# Patient Record
Sex: Female | Born: 1985 | Race: White | Hispanic: No | Marital: Married | Smoking: Current every day smoker
Health system: Southern US, Community
[De-identification: ages and names within clinical notes are randomized; demographics above are authoritative.]

## PROBLEM LIST (undated history)

## (undated) DIAGNOSIS — R569 Unspecified convulsions: Secondary | ICD-10-CM

## (undated) DIAGNOSIS — G43909 Migraine, unspecified, not intractable, without status migrainosus: Secondary | ICD-10-CM

## (undated) DIAGNOSIS — K219 Gastro-esophageal reflux disease without esophagitis: Secondary | ICD-10-CM

## (undated) DIAGNOSIS — D649 Anemia, unspecified: Secondary | ICD-10-CM

## (undated) DIAGNOSIS — N806 Endometriosis in cutaneous scar: Secondary | ICD-10-CM

## (undated) HISTORY — DX: Migraine, unspecified, not intractable, without status migrainosus: G43.909

## (undated) HISTORY — DX: Endometriosis in cutaneous scar: N80.6

## (undated) HISTORY — PX: LAPAROSCOPIC ENDOMETRIOSIS FULGURATION: SUR769

## (undated) HISTORY — PX: WISDOM TOOTH EXTRACTION: SHX21

## (undated) HISTORY — DX: Unspecified convulsions: R56.9

## (undated) HISTORY — PX: TONSILLECTOMY: SUR1361

---
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
## 2005-03-12 ENCOUNTER — Ambulatory Visit: Payer: Self-pay | Admitting: Psychiatry

## 2005-07-16 ENCOUNTER — Emergency Department: Payer: Self-pay | Admitting: Emergency Medicine

## 2005-08-10 ENCOUNTER — Inpatient Hospital Stay: Payer: Self-pay | Admitting: Internal Medicine

## 2005-12-10 ENCOUNTER — Ambulatory Visit: Payer: Self-pay

## 2006-12-21 ENCOUNTER — Emergency Department: Payer: Self-pay | Admitting: Emergency Medicine

## 2007-01-25 ENCOUNTER — Encounter: Payer: Self-pay | Admitting: Family Medicine

## 2007-02-06 ENCOUNTER — Encounter: Payer: Self-pay | Admitting: Family Medicine

## 2007-04-10 ENCOUNTER — Emergency Department: Payer: Self-pay | Admitting: Unknown Physician Specialty

## 2007-08-25 ENCOUNTER — Emergency Department: Payer: Self-pay | Admitting: Emergency Medicine

## 2007-09-25 ENCOUNTER — Emergency Department: Payer: Self-pay | Admitting: Emergency Medicine

## 2008-06-27 ENCOUNTER — Ambulatory Visit: Payer: Self-pay | Admitting: Family Medicine

## 2009-05-30 ENCOUNTER — Ambulatory Visit: Payer: Self-pay

## 2009-05-31 ENCOUNTER — Ambulatory Visit: Payer: Self-pay

## 2010-02-13 ENCOUNTER — Emergency Department: Payer: Self-pay | Admitting: Emergency Medicine

## 2010-05-26 ENCOUNTER — Emergency Department: Payer: Self-pay | Admitting: Emergency Medicine

## 2010-09-23 ENCOUNTER — Encounter: Payer: Self-pay | Admitting: Maternal and Fetal Medicine

## 2010-12-02 ENCOUNTER — Observation Stay: Payer: Self-pay

## 2010-12-17 ENCOUNTER — Encounter: Payer: Self-pay | Admitting: Pediatric Cardiology

## 2011-01-21 ENCOUNTER — Encounter: Payer: Self-pay | Admitting: Pediatric Cardiology

## 2011-01-26 ENCOUNTER — Observation Stay: Payer: Self-pay

## 2011-02-26 ENCOUNTER — Observation Stay: Payer: Self-pay

## 2011-03-12 ENCOUNTER — Observation Stay: Payer: Self-pay

## 2011-03-28 ENCOUNTER — Observation Stay: Payer: Self-pay

## 2011-04-01 ENCOUNTER — Inpatient Hospital Stay: Payer: Self-pay

## 2011-07-24 ENCOUNTER — Ambulatory Visit: Payer: Self-pay | Admitting: Internal Medicine

## 2011-09-25 ENCOUNTER — Ambulatory Visit: Payer: Self-pay | Admitting: Otolaryngology

## 2012-03-01 ENCOUNTER — Ambulatory Visit: Payer: Self-pay | Admitting: Neurology

## 2012-05-16 ENCOUNTER — Emergency Department: Payer: Self-pay | Admitting: Emergency Medicine

## 2012-05-16 LAB — COMPREHENSIVE METABOLIC PANEL
Albumin: 3.7 g/dL (ref 3.4–5.0)
Alkaline Phosphatase: 98 U/L (ref 50–136)
Anion Gap: 10 (ref 7–16)
Bilirubin,Total: 0.3 mg/dL (ref 0.2–1.0)
Chloride: 103 mmol/L (ref 98–107)
EGFR (African American): >60
EGFR (Non-African Amer.): >60
Glucose: 116 mg/dL — ABNORMAL HIGH (ref 65–99)
Potassium: 3.6 mmol/L (ref 3.5–5.1)
SGOT(AST): 25 U/L (ref 15–37)
Total Protein: 7.5 g/dL (ref 6.4–8.2)

## 2012-05-16 LAB — CBC
HCT: 45.9 % (ref 35.0–47.0)
HGB: 15.3 g/dL (ref 12.0–16.0)
MCH: 31.3 pg (ref 26.0–34.0)
RBC: 4.87 10*6/uL (ref 3.80–5.20)
RDW: 13.9 % (ref 11.5–14.5)
WBC: 7.3 10*3/uL (ref 3.6–11.0)

## 2012-05-16 LAB — URINALYSIS, COMPLETE
Glucose,UR: NEGATIVE mg/dL (ref 0–75)
Ketone: NEGATIVE
Protein: NEGATIVE
Specific Gravity: 1.02 (ref 1.003–1.030)
WBC UR: 1 /HPF (ref 0–5)

## 2012-05-16 LAB — PREGNANCY, URINE: Pregnancy Test, Urine: NEGATIVE m[IU]/mL

## 2013-01-27 ENCOUNTER — Emergency Department: Payer: Self-pay | Admitting: Emergency Medicine

## 2013-01-27 LAB — CBC
HCT: 43.6 % (ref 35.0–47.0)
MCH: 31.5 pg (ref 26.0–34.0)
MCHC: 34.2 g/dL (ref 32.0–36.0)
MCV: 92 fL (ref 80–100)
Platelet: 217 10*3/uL (ref 150–440)
RBC: 4.73 10*6/uL (ref 3.80–5.20)
RDW: 13.4 % (ref 11.5–14.5)
WBC: 8.1 10*3/uL (ref 3.6–11.0)

## 2013-01-27 LAB — URINALYSIS, COMPLETE
Glucose,UR: NEGATIVE mg/dL (ref 0–75)
Ketone: NEGATIVE
Leukocyte Esterase: NEGATIVE
Nitrite: NEGATIVE
Ph: 6 (ref 4.5–8.0)
Protein: NEGATIVE
Specific Gravity: 1.014 (ref 1.003–1.030)
WBC UR: <1 /HPF (ref 0–5)

## 2013-01-27 LAB — HCG, QUANTITATIVE, PREGNANCY: Beta Hcg, Quant.: 33800 m[IU]/mL — ABNORMAL HIGH

## 2013-03-11 LAB — HM PAP SMEAR: HM PAP: NORMAL

## 2013-07-01 DIAGNOSIS — Q270 Congenital absence and hypoplasia of umbilical artery: Secondary | ICD-10-CM | POA: Insufficient documentation

## 2013-09-04 ENCOUNTER — Observation Stay: Payer: Self-pay

## 2013-09-04 LAB — URINALYSIS, COMPLETE
Bacteria: NONE SEEN
Glucose,UR: NEGATIVE mg/dL (ref 0–75)
Nitrite: NEGATIVE
Ph: 6 (ref 4.5–8.0)
Protein: 30
RBC,UR: 314 /HPF (ref 0–5)
Specific Gravity: 1.023 (ref 1.003–1.030)
Squamous Epithelial: 3

## 2013-09-07 ENCOUNTER — Ambulatory Visit: Payer: Self-pay | Admitting: Obstetrics and Gynecology

## 2013-09-07 ENCOUNTER — Observation Stay: Payer: Self-pay | Admitting: Obstetrics and Gynecology

## 2013-09-07 HISTORY — PX: TUBAL LIGATION: SHX77

## 2013-09-07 LAB — CBC WITH DIFFERENTIAL/PLATELET
Basophil %: 0.2 %
Eosinophil #: 0 10*3/uL (ref 0.0–0.7)
Eosinophil %: 0.1 %
HCT: 40.5 % (ref 35.0–47.0)
HGB: 14 g/dL (ref 12.0–16.0)
Lymphocyte #: 1.9 10*3/uL (ref 1.0–3.6)
Lymphocyte %: 18.9 %
MCH: 32.3 pg (ref 26.0–34.0)
MCHC: 34.7 g/dL (ref 32.0–36.0)
Monocyte #: 0.6 x10 3/mm (ref 0.2–0.9)
Monocyte %: 6.1 %
Platelet: 204 10*3/uL (ref 150–440)
RDW: 15 % — ABNORMAL HIGH (ref 11.5–14.5)

## 2013-09-08 ENCOUNTER — Inpatient Hospital Stay: Payer: Self-pay | Admitting: Obstetrics and Gynecology

## 2014-07-22 ENCOUNTER — Emergency Department: Payer: Self-pay | Admitting: Emergency Medicine

## 2014-07-22 LAB — BASIC METABOLIC PANEL
Anion Gap: 10 (ref 7–16)
BUN: 11 mg/dL (ref 7–18)
CHLORIDE: 110 mmol/L — AB (ref 98–107)
CO2: 22 mmol/L (ref 21–32)
CREATININE: 0.89 mg/dL (ref 0.60–1.30)
Calcium, Total: 8.4 mg/dL — ABNORMAL LOW (ref 8.5–10.1)
EGFR (African American): >60
EGFR (Non-African Amer.): >60
Glucose: 117 mg/dL — ABNORMAL HIGH (ref 65–99)
Osmolality: 284 (ref 275–301)
Potassium: 3.8 mmol/L (ref 3.5–5.1)
Sodium: 142 mmol/L (ref 136–145)

## 2014-07-22 LAB — CBC
HCT: 41.4 % (ref 35.0–47.0)
HGB: 14.3 g/dL (ref 12.0–16.0)
MCH: 31.3 pg (ref 26.0–34.0)
MCHC: 34.5 g/dL (ref 32.0–36.0)
MCV: 91 fL (ref 80–100)
Platelet: 160 10*3/uL (ref 150–440)
RBC: 4.57 10*6/uL (ref 3.80–5.20)
RDW: 13.7 % (ref 11.5–14.5)
WBC: 7.9 10*3/uL (ref 3.6–11.0)

## 2015-03-30 NOTE — Op Note (Signed)
PATIENT NAME:  Kaitlin Phillips, Kaitlin Phillips MR#:  [id number] DATE OF BIRTH:  Jun 17, 1986  DATE OF PROCEDURE:  09/08/2013  PREOPERATIVE DIAGNOSES:  1.  Intrauterine pregnancy at [redacted] weeks gestational age.  2.  Morbid obesity.  3.  History of prior cesarean section, desires repeat.  4.  Multiparity, desires permanent sterilization.   POSTOPERATIVE DIAGNOSES: 1.  Intrauterine pregnancy at [redacted] weeks gestational age.  2.  Morbid obesity.  3.  History of prior cesarean section, desires repeat.  4.  Multiparity, desires permanent sterilization.   PROCEDURE:  1.  Repeat low transverse cesarean section.  2.  Bilateral tubal ligation using [place] method.   SURGEON: Thomasene Mohair, M.D.   ESTIMATED BLOOD LOSS: 1000 mL.  OPERATIVE FLUIDS: 2500 mL crystalloid.   COMPLICATIONS: None.   FINDINGS:  1.  Normal-appearing gravid uterus, fallopian tubes and ovaries.  2.  Viable female infant with Apgars of 8 at one minute and 9 at five minutes with a weight of 2950 grams.  3.  Very thin lower uterine segment.   SPECIMENS: Portion of right and left fallopian tubes.   CONDITION AT END OF PROCEDURE: Stable.   INDICATION FOR PROCEDURE: The patient is 29 year old, gravida 2, para 1- 0-0-1 at [redacted] weeks gestational age. The patient has a history of prior cesarean section and desired repeat. The patient also strongly desired permanent form of sterilization and has been consented regarding alternatives and the risks and benefits of each. She was also counseled regarding the failure rate of approximately 4 out of every 1000 tubal ligations with increased risk of ectopic pregnancy should pregnancy occur. She still strongly desired to undergo sterilization and C-section. She was therefore taken to the operating room.   PROCEDURE IN DETAIL: The patient was taken to operating room where spinal anesthesia was administered and found to be adequate. She was prepped and draped in the dorsal supine position with a leftward tilt after  a timeout was called. A Pfannenstiel incision was made and carried through the various layers until the peritoneum was identified and entered sharply. Peritoneal opening was extended and a bladder flap was created. The bladder was pulled out of the operative area of interest using a bladder retractor. A very thin lower uterine segment was noted and was bluntly then entered and then extended with cranial and caudal tension. The amniotic sac was ruptured with noted meconium. The fetal vertex was then grasped and elevated to the hysterotomy and delivered with the head followed by the shoulders and the rest of the body without difficulty. The cord was clamped and then cut, and the infant was handed off to the awaiting pediatrician. Cord blood was collected. The placenta was then removed and the uterus was exteriorized and cleared of all clots and debris. The hysterotomy was closed using #0 Vicryl in a running locked fashion. A second layer of the same suture was used to obtain hemostasis. Additional figure-of-eight suture was used to obtain hemostasis.   Attention was turned to the left fallopian tube, which was identified, followed out to the fimbria. A Babcock clamp was placed approximately 3 to 4 cm away from the cornual region. An approximately 3 cm segment of tube was ligated using #0 plain gut. The same procedure was carried out on the right side without difficulty. An extra stitch of Vicryl was needed on the distal ends to obtain hemostasis. After hemostasis was assured, the uterus was returned to the abdomen and the gutters were cleared of all clots and debris. The peritoneum  was closed using #0 Vicryl in a running fashion. The fascia was closed using #0 Maxon using 2 separate sutures each starting from the apices and meeting in the midline where they were tied together. The subcutaneous tissue was closed using 2-0 plain gut such that no greater than 2 cm of its base remained. The skin was closed using  staples.   The patient tolerated the procedure well. Sponge, lap, and needle counts were correct x 2. For antibiotic prophylaxis, the patient received 3 grams of Ancef prior to skin incision. For VTE prophylaxis, the patient was wearing SCDs, which were in place and operating throughout the entire procedure. The patient was taken to the recovery area in stable condition.   ____________________________ Conard NovakStephen D. Jackson, MD sdj:aw D: 09/08/2013 09:45:00 ET T: 09/08/2013 09:59:57 ET JOB#: [id number]  cc: Conard NovakStephen D. Jackson, MD, <Dictator> Conard NovakSTEPHEN D JACKSON MD ELECTRONICALLY SIGNED 09/27/2013 15:19

## 2015-04-17 NOTE — H&P (Signed)
L&D Evaluation:  History:  HPI Pt is a 29 yo G2P1001 at 38.[redacted] weeks GA who presents with c/o fluid leaking in her underwear at home. Pt reports having some fluid leaking around 7am with some bloody discharge. +FM. Reports some lower abdominal pressure and discomfort. Denies urinary symptoms   Presents with leaking fluid   Patient's Medical History other  seizure d/o, pelvic fx, head injury, migraines   Patient's Surgical History Previous C-Section   Medications Pre Natal Vitamins   Allergies other, vicodin, paxil, dilantin   Social History none   Family History Non-Contributory   ROS:  ROS All systems were reviewed.  HEENT, CNS, GI, GU, Respiratory, CV, Renal and Musculoskeletal systems were found to be normal.   Exam:  Vital Signs stable   Urine Protein 3+, blood present in urine   General no apparent distress   Mental Status clear   Chest clear   Heart normal sinus rhythm   Abdomen gravid, non-tender   Back no CVAT   Edema 1+   Mebranes Intact, negative pooling or nitrazine   FHT normal rate with no decels   FHT Description reactive NST   Fetal Heart Rate 140    Ucx absent   Skin dry, no lesions   Lymph no lymphadenopathy    Other Urine- pH 1.023, 3+ blood, 30 protein, negative nitrites or leuks, 314 RBC's, negative bacteria, 3 WBC's.   Impression:  Impression dehydration, reactive NST   Plan:  Plan fluids, discharge, increase fluid intake, instructions given when to call the provider. Discussed s/sx of labor and FKC. Comfort measures for abd pain given. Keep office app[facility].   Electronic Signatures: Jannet MantisSubudhi, Kebrina Friend (CNM)  (Signed 28-Sep-14 10:29)  Authored: L&D Evaluation   Last Updated: 28-Sep-14 10:29 by Jannet MantisSubudhi, Dorismar Chay (CNM)

## 2015-11-19 ENCOUNTER — Ambulatory Visit (INDEPENDENT_AMBULATORY_CARE_PROVIDER_SITE_OTHER): Payer: 59 | Admitting: Physician Assistant

## 2015-11-19 ENCOUNTER — Encounter: Payer: Self-pay | Admitting: Physician Assistant

## 2015-11-19 VITALS — BP 122/78 | HR 99 | Temp 98.0°F | Resp 16 | Wt 253.6 lb

## 2015-11-19 DIAGNOSIS — Z8669 Personal history of other diseases of the nervous system and sense organs: Secondary | ICD-10-CM | POA: Insufficient documentation

## 2015-11-19 DIAGNOSIS — J029 Acute pharyngitis, unspecified: Secondary | ICD-10-CM

## 2015-11-19 DIAGNOSIS — Z87898 Personal history of other specified conditions: Secondary | ICD-10-CM | POA: Insufficient documentation

## 2015-11-19 DIAGNOSIS — J0140 Acute pansinusitis, unspecified: Secondary | ICD-10-CM

## 2015-11-19 MED ORDER — SULFAMETHOXAZOLE-TMP DS 800-160 MG PO TABS: 1.0000 | ORAL_TABLET | Freq: Two times a day (BID) | ORAL | Status: DC

## 2015-11-19 NOTE — Patient Instructions (Addendum)
Sinusitis, Adult °Sinusitis is redness, soreness, and inflammation of the paranasal sinuses. Paranasal sinuses are air pockets within the bones of your face. They are located beneath your eyes, in the middle of your forehead, and above your eyes. In healthy paranasal sinuses, mucus is able to drain out, and air is able to circulate through them by way of your nose. However, when your paranasal sinuses are inflamed, mucus and air can become trapped. This can allow bacteria and other germs to grow and cause infection. °Sinusitis can develop quickly and last only a short time (acute) or continue over a long period (chronic). Sinusitis that lasts for more than 12 weeks is considered chronic. °CAUSES °Causes of sinusitis include: °· Allergies. °· Structural abnormalities, such as displacement of the cartilage that separates your nostrils (deviated septum), which can decrease the air flow through your nose and sinuses and affect sinus drainage. °· Functional abnormalities, such as when the small hairs (cilia) that line your sinuses and help remove mucus do not work properly or are not present. °SIGNS AND SYMPTOMS °Symptoms of acute and chronic sinusitis are the same. The primary symptoms are pain and pressure around the affected sinuses. Other symptoms include: °· Upper toothache. °· Earache. °· Headache. °· Bad breath. °· Decreased sense of smell and taste. °· A cough, which worsens when you are lying flat. °· Fatigue. °· Fever. °· Thick drainage from your nose, which often is green and may contain pus (purulent). °· Swelling and warmth over the affected sinuses. °DIAGNOSIS °Your health care provider will perform a physical exam. During your exam, your health care provider may perform any of the following to help determine if you have acute sinusitis or chronic sinusitis: °· Look in your nose for signs of abnormal growths in your nostrils (nasal polyps). °· Tap over the affected sinus to check for signs of  infection. °· View the inside of your sinuses using an imaging device that has a light attached (endoscope). °If your health care provider suspects that you have chronic sinusitis, one or more of the following tests may be recommended: °· Allergy tests. °· Nasal culture. A sample of mucus is taken from your nose, sent to a lab, and screened for bacteria. °· Nasal cytology. A sample of mucus is taken from your nose and examined by your health care provider to determine if your sinusitis is related to an allergy. °TREATMENT °Most cases of acute sinusitis are related to a viral infection and will resolve on their own within 10 days. Sometimes, medicines are prescribed to help relieve symptoms of both acute and chronic sinusitis. These may include pain medicines, decongestants, nasal steroid sprays, or saline sprays. °However, for sinusitis related to a bacterial infection, your health care provider will prescribe antibiotic medicines. These are medicines that will help kill the bacteria causing the infection. °Rarely, sinusitis is caused by a fungal infection. In these cases, your health care provider will prescribe antifungal medicine. °For some cases of chronic sinusitis, surgery is needed. Generally, these are cases in which sinusitis recurs more than 3 times per year, despite other treatments. °HOME CARE INSTRUCTIONS °· Drink plenty of water. Water helps thin the mucus so your sinuses can drain more easily. °· Use a humidifier. °· Inhale steam 3-4 times a day (for example, sit in the bathroom with the shower running). °· Apply a warm, moist washcloth to your face 3-4 times a day, or as directed by your health care provider. °· Use saline nasal sprays to help   moisten and clean your sinuses. °· Take medicines only as directed by your health care provider. °· If you were prescribed either an antibiotic or antifungal medicine, finish it all even if you start to feel better. °SEEK IMMEDIATE MEDICAL CARE IF: °· You have  increasing pain or severe headaches. °· You have nausea, vomiting, or drowsiness. °· You have swelling around your face. °· You have vision problems. °· You have a stiff neck. °· You have difficulty breathing. °  °This information is not intended to replace advice given to you by your health care provider. Make sure you discuss any questions you have with your health care provider. °  °Document Released: 11/24/2005 Document Revised: 12/15/2014 Document Reviewed: 12/09/2011 °Elsevier Interactive Patient Education ©2016 Elsevier Inc. ° °Pharyngitis °Pharyngitis is redness, pain, and swelling (inflammation) of your pharynx.  °CAUSES  °Pharyngitis is usually caused by infection. Most of the time, these infections are from viruses (viral) and are part of a cold. However, sometimes pharyngitis is caused by bacteria (bacterial). Pharyngitis can also be caused by allergies. Viral pharyngitis may be spread from person to person by coughing, sneezing, and personal items or utensils (cups, forks, spoons, toothbrushes). Bacterial pharyngitis may be spread from person to person by more intimate contact, such as kissing.  °SIGNS AND SYMPTOMS  °Symptoms of pharyngitis include:   °· Sore throat.   °· Tiredness (fatigue).   °· Low-grade fever.   °· Headache. °· Joint pain and muscle aches. °· Skin rashes. °· Swollen lymph nodes. °· Plaque-like film on throat or tonsils (often seen with bacterial pharyngitis). °DIAGNOSIS  °Your health care provider will ask you questions about your illness and your symptoms. Your medical history, along with a physical exam, is often all that is needed to diagnose pharyngitis. Sometimes, a rapid strep test is done. Other lab tests may also be done, depending on the suspected cause.  °TREATMENT  °Viral pharyngitis will usually get better in 3-4 days without the use of medicine. Bacterial pharyngitis is treated with medicines that kill germs (antibiotics).  °HOME CARE INSTRUCTIONS  °· Drink enough water  and fluids to keep your urine clear or pale yellow.   °· Only take over-the-counter or prescription medicines as directed by your health care provider:   °¨ If you are prescribed antibiotics, make sure you finish them even if you start to feel better.   °¨ Do not take aspirin.   °· Get lots of rest.   °· Gargle with 8 oz of salt water (½ tsp of salt per 1 qt of water) as often as every 1-2 hours to soothe your throat.   °· Throat lozenges (if you are not at risk for choking) or sprays may be used to soothe your throat. °SEEK MEDICAL CARE IF:  °· You have large, tender lumps in your neck. °· You have a rash. °· You cough up green, yellow-brown, or bloody spit. °SEEK IMMEDIATE MEDICAL CARE IF:  °· Your neck becomes stiff. °· You drool or are unable to swallow liquids. °· You vomit or are unable to keep medicines or liquids down. °· You have severe pain that does not go away with the use of recommended medicines. °· You have trouble breathing (not caused by a stuffy nose). °MAKE SURE YOU:  °· Understand these instructions. °· Will watch your condition. °· Will get help right away if you are not doing well or get worse. °  °This information is not intended to replace advice given to you by your health care provider. Make sure you discuss any   questions you have with your health care provider. °  °Document Released: 11/24/2005 Document Revised: 09/14/2013 Document Reviewed: 08/01/2013 °Elsevier Interactive Patient Education ©2016 Elsevier Inc. ° °

## 2015-11-19 NOTE — Progress Notes (Signed)
Patient: Kaitlin Phillips Female    DOB: [birth year]   29 y.o.   MRN: [id number] Visit Date: 11/19/2015  Today's Provider: Margaretann Loveless, PA-C   Chief Complaint  Patient presents with  . URI   Subjective:    URI  This is a new problem. The current episode started 1 to 4 weeks ago (Cold symptoms started 2 weeks ago). The problem has been unchanged (She has sons (2 and 4) who have also exhibited sogns of cold). The maximum temperature recorded prior to her arrival was 103 - 104 F (Fever started 3 days ago). The fever has been present for 1 to 2 days. Associated symptoms include congestion, coughing (mild), ear pain (right ear), headaches, joint pain, nausea, rhinorrhea, sinus pain, a sore throat and swollen glands. Pertinent negatives include no abdominal pain, chest pain, diarrhea, dysuria, joint swelling, neck pain, plugged ear sensation, rash, sneezing, vomiting or wheezing. She has tried acetaminophen (Took Tylenol at 5 am today; she has also tried Catering manager liqui-gels) for the symptoms. The treatment provided mild relief.      Allergies  Allergen Reactions  . Penicillins Shortness Of Breath, Swelling and Hives  . Dilantin  [Phenytoin Sodium Extended] Itching  . Vicodin  [Hydrocodone-Acetaminophen] Nausea Only and Nausea And Vomiting   Previous Medications   No medications on file    Review of Systems  Constitutional: Positive for fever, chills and fatigue.  HENT: Positive for congestion, ear pain (right ear), postnasal drip, rhinorrhea, sinus pressure and sore throat. Negative for ear discharge, hearing loss, sneezing, tinnitus, trouble swallowing and voice change.   Eyes: Negative.   Respiratory: Positive for cough (mild) and chest tightness. Negative for choking, shortness of breath, wheezing and stridor.   Cardiovascular: Negative for chest pain, palpitations and leg swelling.  Gastrointestinal: Positive for nausea. Negative for vomiting, abdominal pain and  diarrhea.  Genitourinary: Negative for dysuria.  Musculoskeletal: Positive for joint pain. Negative for neck pain and neck stiffness.  Skin: Negative for rash.  Neurological: Positive for headaches. Negative for dizziness.    Social History  Substance Use Topics  . Smoking status: Current Every Day Smoker -- 1.00 packs/day    Types: Cigarettes  . Smokeless tobacco: Never Used  . Alcohol Use: No   Objective:   BP 122/78 mmHg  Pulse 99  Temp(Src) 98 F (36.7 C) (Oral)  Resp 16  Wt 253 lb 9.6 oz (115.032 kg)  Physical Exam  Constitutional: She appears well-developed and well-nourished. No distress.  HENT:  Head: Normocephalic and atraumatic.  Right Ear: Hearing, external ear and ear canal normal. Tympanic membrane is not erythematous and not bulging. A middle ear effusion is present.  Left Ear: Hearing, external ear and ear canal normal. Tympanic membrane is not erythematous and not bulging. A middle ear effusion is present.  Nose: Right sinus exhibits maxillary sinus tenderness and frontal sinus tenderness. Left sinus exhibits maxillary sinus tenderness and frontal sinus tenderness.  Mouth/Throat: Uvula is midline and mucous membranes are normal. Posterior oropharyngeal erythema present. No oropharyngeal exudate or posterior oropharyngeal edema.  Neck: Normal range of motion. Neck supple. No tracheal deviation present. No thyromegaly present.  Cardiovascular: Normal rate, regular rhythm and normal heart sounds.  Exam reveals no gallop and no friction rub.   No murmur heard. Pulmonary/Chest: Effort normal and breath sounds normal. No stridor. No respiratory distress. She has no wheezes. She has no rales.  Lymphadenopathy:    She has cervical adenopathy.  Skin: She is not diaphoretic.  Vitals reviewed.       Assessment & Plan:     1. Acute pansinusitis, recurrence not specified Worsening 2 weeks with new onset of fever over the last few days. I will treat with Bactrim  800-160 mg to be taken twice daily 10 days. I also advised her to continue a decongestant for congestion and Tylenol for fever. I did advise to take the antibiotic with yogurt to help with GI upset and yeast infection. She voiced understanding. She is to call the office if symptoms fail to improve or worsen.  2. Pharyngitis Most likely secondary to postnasal drip. She does not have tonsils or adenoids. See above medical treatment plan.       Margaretann LovelessJennifer M Burnette, PA-C  [facility]

## 2016-01-01 ENCOUNTER — Ambulatory Visit (INDEPENDENT_AMBULATORY_CARE_PROVIDER_SITE_OTHER): Payer: Managed Care, Other (non HMO) | Admitting: Family Medicine

## 2016-01-01 ENCOUNTER — Encounter: Payer: Self-pay | Admitting: Family Medicine

## 2016-01-01 VITALS — BP 102/76 | HR 76 | Temp 97.6°F | Resp 16 | Wt 257.0 lb

## 2016-01-01 DIAGNOSIS — G43009 Migraine without aura, not intractable, without status migrainosus: Secondary | ICD-10-CM | POA: Diagnosis not present

## 2016-01-01 MED ORDER — KETOROLAC TROMETHAMINE 60 MG/2ML IM SOLN
60.0000 mg | Freq: Once | INTRAMUSCULAR | Status: AC
Start: 2016-01-01 — End: 2016-01-01
  Administered 2016-01-01: 60 mg via INTRAMUSCULAR

## 2016-01-01 MED ORDER — AMITRIPTYLINE HCL 25 MG PO TABS: 25.0000 mg | ORAL_TABLET | Freq: Every day | ORAL | Status: DC

## 2016-01-01 NOTE — Patient Instructions (Signed)
Update your eye exam
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
## 2016-01-03 ENCOUNTER — Telehealth: Payer: Self-pay | Admitting: Family Medicine

## 2016-01-03 ENCOUNTER — Encounter: Payer: Self-pay | Admitting: Family Medicine

## 2016-01-03 ENCOUNTER — Other Ambulatory Visit: Payer: Self-pay | Admitting: Family Medicine

## 2016-01-03 ENCOUNTER — Ambulatory Visit (INDEPENDENT_AMBULATORY_CARE_PROVIDER_SITE_OTHER): Payer: Managed Care, Other (non HMO) | Admitting: Family Medicine

## 2016-01-03 VITALS — BP 100/74 | HR 98 | Temp 97.6°F | Resp 16 | Wt 258.6 lb

## 2016-01-03 DIAGNOSIS — G43019 Migraine without aura, intractable, without status migrainosus: Secondary | ICD-10-CM

## 2016-01-03 DIAGNOSIS — Z8669 Personal history of other diseases of the nervous system and sense organs: Secondary | ICD-10-CM

## 2016-01-03 MED ORDER — SUMATRIPTAN SUCCINATE 50 MG PO TABS
50.0000 mg | ORAL_TABLET | Freq: Once | ORAL | Status: DC
Start: 2016-01-03 — End: 2016-12-22

## 2016-01-03 MED ORDER — ISOMETHEPTENE-DICHLORAL-APAP 65-100-325 MG PO CAPS: ORAL_CAPSULE | ORAL | Status: DC

## 2016-01-03 MED ORDER — METOPROLOL SUCCINATE ER 25 MG PO TB24: 25.0000 mg | ORAL_TABLET | Freq: Every day | ORAL | Status: DC

## 2016-01-03 NOTE — Telephone Encounter (Deleted)
Pt states she was in 2 days ago and rec'd a Rx amitriptyline (ELAVIL) 25 MG tablet.  Pt states she is still having a headache/migraine.  Pt is also having dry mouth and constipation.  Pt is requesting advise.  CB#[phone]/MW

## 2016-01-03 NOTE — Telephone Encounter (Signed)
Patient has been advised. KW 

## 2016-01-03 NOTE — Progress Notes (Signed)
Subjective:     Patient ID: Kaitlin Phillips, female   DOB: [birth year], 30 y.o.   MRN: [id number]  HPI  Chief Complaint  Patient presents with  . Migraine    Follow up from 01/01/16, last visit patient was prescribed Amitriptyline  qhs patient reports since starting medication her headache has got worse. Patient reports symptoms of dry mouth and constipation, she states that it has been harder to go to sleep at night.   States Toradol injection of two days ago did not abort her residual headache. Was able to sleep with amitriptyline the that night but headache returned yesterday. No further help from amitriptyline with side effects. She is accompanied by her two year old today.   Review of Systems     Objective:   Physical Exam  Constitutional: She appears well-developed and well-nourished. No distress.       Assessment:    1. Intractable migraine without aura and without status migrainosus - isometheptene-acetaminophen-dichloralphenazone (MIDRIN) 65-100-325 MG capsule; Maximum 5 capsules in 12 hours for migraine headaches. May take two at onset then one every hour if needed.  Dispense: 30 capsule; Refill: 0  2. History of migraine headaches - metoprolol succinate (TOPROL-XL) 25 MG 24 hr tablet; Take 1 tablet (25 mg total) by mouth daily.  Dispense: 30 tablet; Refill: 0    Plan:    Consider oral imitrex and/or referral to neurology.

## 2016-01-03 NOTE — Telephone Encounter (Signed)
Pharmacy does not have Metoprolol in stock.  [facility] it would be next week to get it in.  She is asking to get a refill on her Fioricet (pres by Dr. 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      Uses [facility] in [place].

## 2016-01-03 NOTE — Telephone Encounter (Signed)
Please let her know I have sent in Imitrex to try if the Midrin is unavailable. I do not prescribe Fioricet. If this is the only thing she wants may want to sit down with one of the M.D.'s here.

## 2016-01-03 NOTE — Patient Instructions (Signed)
Let me know if you are not tolerating the new medication or wish referral to neurology.

## 2016-02-11 ENCOUNTER — Ambulatory Visit (INDEPENDENT_AMBULATORY_CARE_PROVIDER_SITE_OTHER): Payer: Managed Care, Other (non HMO) | Admitting: Family Medicine

## 2016-02-11 ENCOUNTER — Encounter: Payer: Self-pay | Admitting: Family Medicine

## 2016-02-11 ENCOUNTER — Other Ambulatory Visit: Payer: Self-pay

## 2016-02-11 VITALS — BP 134/96 | HR 96 | Temp 97.5°F | Resp 16 | Wt 259.0 lb

## 2016-02-11 DIAGNOSIS — G43709 Chronic migraine without aura, not intractable, without status migrainosus: Secondary | ICD-10-CM | POA: Diagnosis not present

## 2016-02-11 DIAGNOSIS — Z720 Tobacco use: Secondary | ICD-10-CM

## 2016-02-11 MED ORDER — TOPIRAMATE 25 MG PO TABS: 50.0000 mg | ORAL_TABLET | Freq: Two times a day (BID) | ORAL | Status: DC
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
## 2016-03-11 ENCOUNTER — Ambulatory Visit: Payer: Managed Care, Other (non HMO) | Admitting: Family Medicine

## 2016-07-11 NOTE — Telephone Encounter (Signed)
error 

## 2016-12-12 ENCOUNTER — Other Ambulatory Visit: Payer: Self-pay | Admitting: Obstetrics and Gynecology

## 2016-12-12 ENCOUNTER — Encounter: Payer: Self-pay | Admitting: *Deleted

## 2016-12-12 ENCOUNTER — Other Ambulatory Visit (HOSPITAL_COMMUNITY): Payer: Self-pay | Admitting: Obstetrics and Gynecology

## 2016-12-12 DIAGNOSIS — M7989 Other specified soft tissue disorders: Secondary | ICD-10-CM

## 2016-12-12 DIAGNOSIS — R1032 Left lower quadrant pain: Secondary | ICD-10-CM

## 2016-12-22 ENCOUNTER — Ambulatory Visit (INDEPENDENT_AMBULATORY_CARE_PROVIDER_SITE_OTHER): Payer: Managed Care, Other (non HMO) | Admitting: General Surgery

## 2016-12-22 ENCOUNTER — Encounter: Payer: Self-pay | Admitting: General Surgery

## 2016-12-22 ENCOUNTER — Inpatient Hospital Stay: Payer: Self-pay

## 2016-12-22 VITALS — BP 120/70 | HR 82 | Resp 12 | Ht 65.0 in | Wt 215.0 lb

## 2016-12-22 DIAGNOSIS — R1032 Left lower quadrant pain: Secondary | ICD-10-CM

## 2016-12-22 DIAGNOSIS — R1904 Left lower quadrant abdominal swelling, mass and lump: Secondary | ICD-10-CM

## 2016-12-22 NOTE — Patient Instructions (Signed)
Follow up here after MRI on 10/24/17.

## 2016-12-22 NOTE — Progress Notes (Addendum)
Patient ID: Kaitlin Phillips, female   DOB: 30-Mar-1986, 31 y.o.   MRN: [id number]  Chief Complaint  Patient presents with  . Abdominal Pain    HPI Kaitlin Phillips is a 31 y.o. female here for assessment of left lower quadrant pain and possible hernia. She reports she has had pain and discomfort with this area for 3 years. It has increased significantly in the past 4 months. Describes the pain as a burning sensation, and states that she experiences diaphoresis, and dizziness upon standing occasionally with the pain. The area does bulge out. She does have a history of 2 cesarian sections. She is scheduled for an MRI of the abdomen on 12/24/16. She is here today with her mother, Kaitlin Phillips.  I have reviewed the history of present illness with the patient.  HPI  Past Medical History:  Diagnosis Date  . Anemia    h/o years ago  . GERD (gastroesophageal reflux disease)    takes apple cider vinegar daily and has no problems with GERD  . Migraine headache   . Seizure (HCC)    last seizure in 2008    Past Surgical History:  Procedure Laterality Date  . CESAREAN SECTION  09/08/2013   [facility]- x2  . EXCISION OF ABDOMINAL WALL TUMOR N/A 01/06/2017   Procedure: EXCISION OF ABDOMINAL WALL MASS;  Surgeon: Kieth Brightly, MD;  Location: [facility] ORS;  Service: General;  Laterality: N/A;  . FACIAL FRACTURE SURGERY  2000  . LAPAROSCOPIC ENDOMETRIOSIS FULGURATION    . TONSILLECTOMY     and tubes in ears  . TUBAL LIGATION  09/2013   [facility]  . WISDOM TOOTH EXTRACTION      Family History  Problem Relation Age of Onset  . Hypertension Mother   . Depression Sister   . Lung cancer Father     Social History Social History  Substance Use Topics  . Smoking status: Current Every Day Smoker    Packs/day: 1.00    Years: 7.00    Types: Cigarettes  . Smokeless tobacco: Never Used  . Alcohol use Yes     Comment: rare    Allergies  Allergen Reactions  . Amitriptyline Anaphylaxis  . Amoxicillin  Anaphylaxis  . Penicillins Hives, Shortness Of Breath and Swelling    ANY MEDS WITHIN THE "CILLIN"  FAMILY   . Dilantin  [Phenytoin Sodium Extended] Itching  . Vicodin  [Hydrocodone-Acetaminophen] Nausea Only and Nausea And Vomiting    Current Outpatient Prescriptions  Medication Sig Dispense Refill  . ibuprofen (ADVIL,MOTRIN) 800 MG tablet Take 800 mg by mouth every 8 (eight) hours as needed.    Marland Kitchen levonorgestrel (MIRENA) 20 MCG/24HR IUD 1 each by Intrauterine route once.    Marland Kitchen HYDROcodone-acetaminophen (NORCO) 5-325 MG tablet Take 1-2 tablets by mouth every 4 (four) hours as needed for moderate pain. 30 tablet 0  . Multiple Vitamins-Minerals (AIRBORNE PO) Take by mouth as needed.    Marland Kitchen Phenylephrine-Aspirin (ALKA-SELTZER PLUS SINUS PO) Take 1 tablet by mouth as needed.    . promethazine (PHENERGAN) 12.5 MG tablet Take 1 tablet (12.5 mg total) by mouth every 4 (four) hours as needed for nausea or vomiting. 20 tablet 0  . traMADol (ULTRAM) 50 MG tablet Take 1 tablet (50 mg total) by mouth every 6 (six) hours as needed. 20 tablet 0   No current facility-administered medications for this visit.     Review of Systems Review of Systems  Constitutional: Negative.   Respiratory: Negative.  Cardiovascular: Negative.     Blood pressure 120/70, pulse 82, resp. rate 12, height 5\' 5"  (1.651 m), weight 215 lb (97.5 kg).  Physical Exam Physical Exam  Constitutional: She is oriented to person, place, and time. She appears well-developed and well-nourished.  Eyes: Conjunctivae are normal. No scleral icterus.  Neck: Neck supple.  Cardiovascular: Normal rate, regular rhythm and normal heart sounds.   Pulmonary/Chest: Effort normal and breath sounds normal.  Abdominal: Soft. Normal appearance and bowel sounds are normal. There is tenderness in the left lower quadrant.    Lymphadenopathy:    She has no cervical adenopathy.  Neurological: She is alert and oriented to person, place, and time.    Skin: Skin is warm and dry.  Psychiatric: She has a normal mood and affect.    Data Reviewed Data reviewed.   Assessment    Abdominal ultrasound performed.  1-2 cm hypoechoic irregular poorly defined area identified within the muscle of the abdomen at the patient's most tender spot. Suspected endometriosis, need MRI results to confirm.    Plan    MRI of abdomen scheduled for 12/24/2016.  Follow-up after MRI to discuss results and plan.    This has been scribed by Sinda Duaryl-Lyn M Kennedy LPN    SANKAR,SEEPLAPUTHUR G 01/08/2017, 10:58 AM

## 2016-12-23 ENCOUNTER — Telehealth: Payer: Self-pay | Admitting: *Deleted

## 2016-12-23 NOTE — Telephone Encounter (Signed)
Patient called stating her insurance would not cover her for an MRI for tomorrow. Wants to know if there is another test that can be ordered ? She stated she will call Dr. Ramiro HarvestStaebler's office to let them know what is going on and let us know how she will proceed.

## 2016-12-23 NOTE — Telephone Encounter (Signed)
Patient called back and is going to still have her MRI tomorrow.

## 2016-12-24 ENCOUNTER — Ambulatory Visit
Admission: RE | Admit: 2016-12-24 | Discharge: 2016-12-24 | Disposition: A | Discharge status: Home / Self Care | Payer: Managed Care, Other (non HMO) | Source: Ambulatory Visit | Attending: Obstetrics and Gynecology | Admitting: Obstetrics and Gynecology

## 2016-12-24 DIAGNOSIS — M799 Soft tissue disorder, unspecified: Secondary | ICD-10-CM | POA: Diagnosis not present

## 2016-12-24 DIAGNOSIS — R1032 Left lower quadrant pain: Secondary | ICD-10-CM

## 2016-12-24 DIAGNOSIS — M7989 Other specified soft tissue disorders: Secondary | ICD-10-CM

## 2016-12-24 MED ORDER — GADOBENATE DIMEGLUMINE 529 MG/ML IV SOLN
20.0000 mL | Freq: Once | INTRAVENOUS | Status: AC | PRN
  Administered 2016-12-24: 20 mL via INTRAVENOUS

## 2016-12-30 ENCOUNTER — Ambulatory Visit (INDEPENDENT_AMBULATORY_CARE_PROVIDER_SITE_OTHER): Payer: Managed Care, Other (non HMO) | Admitting: General Surgery

## 2016-12-30 ENCOUNTER — Encounter: Payer: Self-pay | Admitting: General Surgery

## 2016-12-30 ENCOUNTER — Inpatient Hospital Stay: Payer: Self-pay

## 2016-12-30 VITALS — BP 126/74 | HR 80 | Resp 12 | Ht 65.0 in | Wt 217.0 lb

## 2016-12-30 DIAGNOSIS — R1900 Intra-abdominal and pelvic swelling, mass and lump, unspecified site: Secondary | ICD-10-CM | POA: Diagnosis not present

## 2016-12-30 NOTE — Patient Instructions (Signed)
The patient [facility]. She will pre admit by phone. The patient is aware of date and instructions.

## 2016-12-30 NOTE — Progress Notes (Signed)
Patient ID: MRY LAMIA, female   DOB: 18-Oct-1986, 31 y.o.   MRN: [id number]  Chief Complaint  Patient presents with  . Mass    HPI Kaitlin Phillips is a 31 y.o. female here today to discuss abdominal wall mass. MRI of abdomen on  12/24/2016. States that pain in area is worse than during last visit and more tender.  I have reviewed the history of present illness with the patient.   HPI  Past Medical History:  Diagnosis Date  . Migraine headache   . Seizure [facility]     Past Surgical History:  Procedure Laterality Date  . CESAREAN SECTION  09/08/13   [facility]  . TUBAL LIGATION  09/2013   [facility]    Family History  Problem Relation Age of Onset  . Hypertension Mother   . Depression Sister   . Lung cancer Father     Social History Social History  Substance Use Topics  . Smoking status: Current Every Day Smoker    Packs/day: 1.00    Types: Cigarettes  . Smokeless tobacco: Never Used  . Alcohol use Yes    Allergies  Allergen Reactions  . Amitriptyline Anaphylaxis  . Penicillins Shortness Of Breath, Swelling and Hives  . Dilantin  [Phenytoin Sodium Extended] Itching  . Vicodin  [Hydrocodone-Acetaminophen] Nausea Only and Nausea And Vomiting    Current Outpatient Prescriptions  Medication Sig Dispense Refill  . ibuprofen (ADVIL,MOTRIN) 800 MG tablet Take 800 mg by mouth every 8 (eight) hours as needed.    Marland Kitchen levonorgestrel (MIRENA) 20 MCG/24HR IUD 1 each by Intrauterine route once.     No current facility-administered medications for this visit.     Review of Systems Review of Systems  Constitutional: Negative.   Respiratory: Negative.   Cardiovascular: Negative.     Blood pressure 126/74, pulse 80, resp. rate 12, height 5\' 5"  (1.651 m), weight 217 lb (98.4 kg).  Physical Exam Physical Exam  Data Reviewed Prior notes, ultrasound, and MRI. MRI showed a mass in the left lower abdominal area.   Assessment    Core biopsy performed with patient consent.    Plan    Procedure Note: CORE BIOPSY REPORT  Name:  Kaitlin Phillips DOB:  1986/12/08  Vital signs:BP 126/74   Pulse 80   Resp 12   Ht 5\' 5"  (1.651 m)   Wt 217 lb (98.4 kg)   BMI 36.11 kg/m   Lesion:  Mass abdominal wall left lower quadrant   Local anesthetic:   10 ml of 1% Xylocaine/0.5% Marcaine  Prep: Chloraprep Solution  Device:  14 Gauge Bard    Ultrasound guidance:  Used  Patient tolerance: Patient tolerated the procedure well  Approach: LM  Clip: None  Dressing: Steristrips telfa tegaderm  Ice pack applied. Written instructions provided to patient regarding wound care.   Patient advised that patient will be contacted by phone when pathology report is available.  Given the severe pain patient has associated with this mass, which is located into the muscle area. Excision would be most appropriate for relief of pain. More than likely, this is endometriosis given the pain associated with it. If pathology suggests a tumor that requires more radical resection, we can plan accordingly. Patient is agreeable to this.    [facility] G       The patient is scheduled for surgery at [facility] on 01/06/17. She will pre admit by phone. The patient is aware of date and instructions.   This information has  been scribed by Ples SpecterJessica Qualls CMA.    Kashari Chalmers G 12/30/2016, 3:21 PM

## 2016-12-31 ENCOUNTER — Telehealth: Payer: Self-pay

## 2016-12-31 ENCOUNTER — Telehealth: Payer: Self-pay | Admitting: *Deleted

## 2016-12-31 NOTE — Telephone Encounter (Signed)
Called pt on her mobile phone. She states she is feeling better since this afternoon. No more nausea or vomiting.  Path report is still pending. Will likely have a report tomorrow, will call her then and check on how she is feeling

## 2016-12-31 NOTE — Telephone Encounter (Signed)
Patient called back this afternoon, and is still feeling bad. She states she found out that her husband has the Flu. Since it was late in the afternoon, I recommended contacting her Primary care Doctor. She wanted to wait to speak with you since she is scheduled to have surgery next week. Please advise. Thanks

## 2016-12-31 NOTE — Telephone Encounter (Signed)
Patient called and reports that when she got out of bed today she started having nausea and had vomited several times. She drank some Ginger ale and took her antinausea medication(Promethazine 25 mg). She reports than that she threw up again about 25 minutes after. She states that she also has a low grade fever of 99.9 and some chills. She wanted to make sure we were aware of what was going on as she is scheduled for surgery on 01/06/17. Dr Evette CristalSankar notified.

## 2017-01-01 ENCOUNTER — Telehealth: Payer: Self-pay | Admitting: *Deleted

## 2017-01-01 NOTE — Telephone Encounter (Signed)
-----   Message from Kieth BrightlySeeplaputhur G Sankar, MD sent at 01/01/2017  9:15 AM EST ----- Please inform pt -path showed endometriosis. Will plan for excision as scheduled.

## 2017-01-01 NOTE — Telephone Encounter (Signed)
Notified patient as instructed, patient pleased. Discussed follow-up appointments, patient agrees  

## 2017-01-02 ENCOUNTER — Encounter
Admission: RE | Admit: 2017-01-02 | Discharge: 2017-01-02 | Disposition: A | Discharge status: Home / Self Care | Payer: Managed Care, Other (non HMO) | Source: Ambulatory Visit | Attending: General Surgery | Admitting: General Surgery

## 2017-01-02 HISTORY — DX: Anemia, unspecified: D64.9

## 2017-01-02 HISTORY — DX: Gastro-esophageal reflux disease without esophagitis: K21.9

## 2017-01-02 NOTE — Patient Instructions (Signed)
  Your procedure is scheduled on: 01-06-17 Report to Same Day Surgery 2nd floor medical mall [facility](Medical Mall Entrance-take elevator on left to 2nd floor.  Check in with surgery information desk.) To find out your arrival time please call [phone] between 1PM - 3PM on 01-05-17  Remember: Instructions that are not followed completely may result in serious medical risk, up to and including death, or upon the discretion of your surgeon and anesthesiologist your surgery may need to be rescheduled.    _x___ 1. Do not eat food or drink liquids after midnight. No gum chewing or hard candies.     __x__ 2. No Alcohol for 24 hours before or after surgery.   __x__3. No Smoking for 24 prior to surgery.   ____  4. Bring all medications with you on the day of surgery if instructed.    __x__ 5. Notify your doctor if there is any change in your medical condition     (cold, fever, infections).     Do not wear jewelry, make-up, hairpins, clips or nail polish.  Do not wear lotions, powders, or perfumes. You may wear deodorant.  Do not shave 48 hours prior to surgery. Men may shave face and neck.  Do not bring valuables to the hospital.    [facility] is not responsible for any belongings or valuables.               Contacts, dentures or bridgework may not be worn into surgery.  Leave your suitcase in the car. After surgery it may be brought to your room.  For patients admitted to the hospital, discharge time is determined by your treatment team.   Patients discharged the day of surgery will not be allowed to drive home.  You will need someone to drive you home and stay with you the night of your procedure.    Please read over the following fact sheets that you were given:   [facility] Preparing for Surgery and or MRSA Information   _x___ Take these medicines the morning of surgery with A SIP OF WATER:    1. NONE  2.  3.  4.  5.  6.  ____Fleets enema or Magnesium Citrate as directed.   _x___  Use CHG Soap or sage wipes as directed on instruction sheet   ____ Use inhalers on the day of surgery and bring to hospital day of surgery  ____ Stop metformin 2 days prior to surgery    ____ Take 1/2 of usual insulin dose the night before surgery and none on the morning of  surgery.   ____ Stop Aspirin, Coumadin, Pllavix ,Eliquis, Effient, or Pradaxa  x__ Stop Anti-inflammatories such as Advil, Aleve, Ibuprofen, Motrin, Naproxen,          Naprosyn, Goodies powders or aspirin products NOW-Ok to take Tylenol-CHECK TO MAKE SURE YOUR ALKA-SELTZER SINUS DOES NOT HAVE  ANY ASPIRIN IN IT   _X___ Stop supplements until after surgery-STOP AIRBORNE NOW  ____ Bring C-Pap to the hospital.

## 2017-01-06 ENCOUNTER — Ambulatory Visit
Admission: RE | Admit: 2017-01-06 | Discharge: 2017-01-06 | Disposition: A | Discharge status: Home / Self Care | Payer: Managed Care, Other (non HMO) | Source: Ambulatory Visit | Attending: General Surgery | Admitting: General Surgery

## 2017-01-06 ENCOUNTER — Encounter
Admission: RE | Disposition: A | Discharge status: Home / Self Care | Payer: Self-pay | Source: Ambulatory Visit | Attending: General Surgery

## 2017-01-06 ENCOUNTER — Ambulatory Visit: Payer: Managed Care, Other (non HMO) | Admitting: Anesthesiology

## 2017-01-06 ENCOUNTER — Encounter: Payer: Self-pay | Admitting: *Deleted

## 2017-01-06 ENCOUNTER — Other Ambulatory Visit: Payer: Self-pay | Admitting: General Surgery

## 2017-01-06 DIAGNOSIS — Z793 Long term (current) use of hormonal contraceptives: Secondary | ICD-10-CM | POA: Diagnosis not present

## 2017-01-06 DIAGNOSIS — N808 Other endometriosis: Secondary | ICD-10-CM | POA: Diagnosis present

## 2017-01-06 DIAGNOSIS — R1900 Intra-abdominal and pelvic swelling, mass and lump, unspecified site: Secondary | ICD-10-CM

## 2017-01-06 DIAGNOSIS — N809 Endometriosis, unspecified: Secondary | ICD-10-CM | POA: Diagnosis not present

## 2017-01-06 DIAGNOSIS — F17210 Nicotine dependence, cigarettes, uncomplicated: Secondary | ICD-10-CM | POA: Diagnosis not present

## 2017-01-06 HISTORY — PX: EXCISION OF ABDOMINAL WALL TUMOR: SHX6687

## 2017-01-06 LAB — POCT PREGNANCY, URINE: Preg Test, Ur: NEGATIVE

## 2017-01-06 SURGERY — EXCISION, NEOPLASM, ABDOMINAL WALL
Anesthesia: General

## 2017-01-06 MED ORDER — GLYCOPYRROLATE 0.2 MG/ML IJ SOLN
INTRAMUSCULAR | Status: AC
  Filled 2017-01-06: qty 1

## 2017-01-06 MED ORDER — PROPOFOL 10 MG/ML IV BOLUS
INTRAVENOUS | Status: AC
  Filled 2017-01-06: qty 20

## 2017-01-06 MED ORDER — LIDOCAINE HCL (CARDIAC) 20 MG/ML IV SOLN
INTRAVENOUS | Status: DC | PRN
  Administered 2017-01-06: 100 mg via INTRAVENOUS

## 2017-01-06 MED ORDER — ONDANSETRON HCL 4 MG/2ML IJ SOLN: 4.0000 mg | Freq: Once | INTRAMUSCULAR | Status: DC | PRN

## 2017-01-06 MED ORDER — FENTANYL CITRATE (PF) 100 MCG/2ML IJ SOLN
25.0000 ug | INTRAMUSCULAR | Status: AC | PRN
  Administered 2017-01-06 (×6): 25 ug via INTRAVENOUS

## 2017-01-06 MED ORDER — NALOXONE HCL 2 MG/2ML IJ SOSY
0.0500 mg | PREFILLED_SYRINGE | Freq: Once | INTRAMUSCULAR | Status: AC
  Administered 2017-01-06: 0.05 mg via INTRAVENOUS

## 2017-01-06 MED ORDER — PHENYLEPHRINE HCL 10 MG/ML IJ SOLN
INTRAMUSCULAR | Status: DC | PRN
  Administered 2017-01-06 (×3): 100 ug via INTRAVENOUS

## 2017-01-06 MED ORDER — ACETAMINOPHEN 10 MG/ML IV SOLN
INTRAVENOUS | Status: AC
Start: 2017-01-06 — End: 2017-01-06
  Filled 2017-01-06: qty 100

## 2017-01-06 MED ORDER — CIPROFLOXACIN IN D5W 400 MG/200ML IV SOLN: 400.0000 mg | INTRAVENOUS | Status: DC

## 2017-01-06 MED ORDER — DIPHENHYDRAMINE HCL 50 MG/ML IJ SOLN
25.0000 mg | Freq: Once | INTRAMUSCULAR | Status: AC
  Administered 2017-01-06: 25 mg via INTRAVENOUS

## 2017-01-06 MED ORDER — SODIUM CHLORIDE 0.9 % IJ SOLN
INTRAMUSCULAR | Status: AC
  Filled 2017-01-06: qty 10

## 2017-01-06 MED ORDER — BUPIVACAINE HCL (PF) 0.5 % IJ SOLN
INTRAMUSCULAR | Status: DC | PRN
  Administered 2017-01-06: 30 mL

## 2017-01-06 MED ORDER — PROPOFOL 10 MG/ML IV BOLUS
INTRAVENOUS | Status: DC | PRN
  Administered 2017-01-06: 180 mg via INTRAVENOUS
  Administered 2017-01-06: 20 mg via INTRAVENOUS

## 2017-01-06 MED ORDER — SUGAMMADEX SODIUM 200 MG/2ML IV SOLN
INTRAVENOUS | Status: DC | PRN
  Administered 2017-01-06: 200 mg via INTRAVENOUS

## 2017-01-06 MED ORDER — ROCURONIUM BROMIDE 50 MG/5ML IV SOSY
PREFILLED_SYRINGE | INTRAVENOUS | Status: AC
Start: 2017-01-06 — End: 2017-01-06
  Filled 2017-01-06: qty 5

## 2017-01-06 MED ORDER — DIPHENHYDRAMINE HCL 50 MG/ML IJ SOLN
INTRAMUSCULAR | Status: AC
  Filled 2017-01-06: qty 1

## 2017-01-06 MED ORDER — TRAMADOL HCL 50 MG PO TABS
ORAL_TABLET | ORAL | Status: AC
  Administered 2017-01-06: 50 mg via ORAL
  Filled 2017-01-06: qty 1

## 2017-01-06 MED ORDER — FENTANYL CITRATE (PF) 100 MCG/2ML IJ SOLN
INTRAMUSCULAR | Status: AC
  Filled 2017-01-06: qty 2

## 2017-01-06 MED ORDER — PROMETHAZINE HCL 12.5 MG PO TABS: 12.5000 mg | ORAL_TABLET | ORAL | 0 refills | Status: DC | PRN

## 2017-01-06 MED ORDER — ROCURONIUM BROMIDE 100 MG/10ML IV SOLN
INTRAVENOUS | Status: DC | PRN
  Administered 2017-01-06: 50 mg via INTRAVENOUS

## 2017-01-06 MED ORDER — CHLORHEXIDINE GLUCONATE CLOTH 2 % EX PADS: 6.0000 | MEDICATED_PAD | Freq: Once | CUTANEOUS | Status: DC

## 2017-01-06 MED ORDER — HYDROCODONE-ACETAMINOPHEN 5-325 MG PO TABS: 1.0000 | ORAL_TABLET | ORAL | 0 refills | Status: DC | PRN

## 2017-01-06 MED ORDER — CIPROFLOXACIN IN D5W 400 MG/200ML IV SOLN
INTRAVENOUS | Status: AC
  Filled 2017-01-06: qty 200

## 2017-01-06 MED ORDER — NALOXONE HCL 2 MG/2ML IJ SOSY
PREFILLED_SYRINGE | INTRAMUSCULAR | Status: AC
  Filled 2017-01-06: qty 2

## 2017-01-06 MED ORDER — LIDOCAINE HCL (PF) 2 % IJ SOLN
INTRAMUSCULAR | Status: AC
  Filled 2017-01-06: qty 2

## 2017-01-06 MED ORDER — MIDAZOLAM HCL 2 MG/2ML IJ SOLN
INTRAMUSCULAR | Status: DC | PRN
Start: 2017-01-06 — End: 2017-01-06
  Administered 2017-01-06: 1 mg via INTRAVENOUS

## 2017-01-06 MED ORDER — SUGAMMADEX SODIUM 200 MG/2ML IV SOLN
INTRAVENOUS | Status: AC
  Filled 2017-01-06: qty 2

## 2017-01-06 MED ORDER — LIDOCAINE HCL (PF) 1 % IJ SOLN
INTRAMUSCULAR | Status: AC
Start: 2017-01-06 — End: 2017-01-06
  Filled 2017-01-06: qty 30

## 2017-01-06 MED ORDER — MIDAZOLAM HCL 2 MG/2ML IJ SOLN
INTRAMUSCULAR | Status: AC
  Filled 2017-01-06: qty 2

## 2017-01-06 MED ORDER — ACETAMINOPHEN 10 MG/ML IV SOLN
INTRAVENOUS | Status: DC | PRN
  Administered 2017-01-06: 1000 mg via INTRAVENOUS

## 2017-01-06 MED ORDER — ONDANSETRON HCL 4 MG/2ML IJ SOLN
INTRAMUSCULAR | Status: AC
Start: 2017-01-06 — End: 2017-01-06
  Filled 2017-01-06: qty 2

## 2017-01-06 MED ORDER — FAMOTIDINE 20 MG PO TABS
20.0000 mg | ORAL_TABLET | Freq: Once | ORAL | Status: AC
  Administered 2017-01-06: 20 mg via ORAL

## 2017-01-06 MED ORDER — FENTANYL CITRATE (PF) 100 MCG/2ML IJ SOLN
INTRAMUSCULAR | Status: DC | PRN
  Administered 2017-01-06: 50 ug via INTRAVENOUS
  Administered 2017-01-06: 25 ug via INTRAVENOUS
  Administered 2017-01-06: 100 ug via INTRAVENOUS
  Administered 2017-01-06: 25 ug via INTRAVENOUS

## 2017-01-06 MED ORDER — DEXAMETHASONE SODIUM PHOSPHATE 10 MG/ML IJ SOLN
INTRAMUSCULAR | Status: DC | PRN
  Administered 2017-01-06: 10 mg via INTRAVENOUS

## 2017-01-06 MED ORDER — DEXAMETHASONE SODIUM PHOSPHATE 10 MG/ML IJ SOLN
INTRAMUSCULAR | Status: AC
  Filled 2017-01-06: qty 1

## 2017-01-06 MED ORDER — TRAMADOL HCL 50 MG PO TABS: 50.0000 mg | ORAL_TABLET | Freq: Four times a day (QID) | ORAL | 0 refills | Status: DC | PRN

## 2017-01-06 MED ORDER — FENTANYL CITRATE (PF) 100 MCG/2ML IJ SOLN
INTRAMUSCULAR | Status: AC
  Administered 2017-01-06: 25 ug via INTRAVENOUS
  Filled 2017-01-06: qty 2

## 2017-01-06 MED ORDER — FAMOTIDINE 20 MG PO TABS
ORAL_TABLET | ORAL | Status: AC
  Filled 2017-01-06: qty 1

## 2017-01-06 MED ORDER — PHENYLEPHRINE HCL 10 MG/ML IJ SOLN
INTRAMUSCULAR | Status: AC
  Filled 2017-01-06: qty 1

## 2017-01-06 MED ORDER — ESMOLOL HCL 100 MG/10ML IV SOLN
INTRAVENOUS | Status: DC | PRN
  Administered 2017-01-06: 10 mg via INTRAVENOUS

## 2017-01-06 MED ORDER — BUPIVACAINE HCL (PF) 0.5 % IJ SOLN
INTRAMUSCULAR | Status: AC
  Filled 2017-01-06: qty 30

## 2017-01-06 MED ORDER — LACTATED RINGERS IV SOLN
INTRAVENOUS | Status: DC
  Administered 2017-01-06 (×2): via INTRAVENOUS

## 2017-01-06 MED ORDER — TRAMADOL HCL 50 MG PO TABS
50.0000 mg | ORAL_TABLET | Freq: Four times a day (QID) | ORAL | Status: DC | PRN
  Administered 2017-01-06: 50 mg via ORAL

## 2017-01-06 SURGICAL SUPPLY — 23 items
BLADE SURG 15 STRL LF DISP TIS (BLADE) ×1 IMPLANT
BLADE SURG 15 STRL SS (BLADE) ×2
CANISTER SUCT 1200ML W/VALVE (MISCELLANEOUS) ×3 IMPLANT
CHLORAPREP W/TINT 26ML (MISCELLANEOUS) ×3 IMPLANT
DRAPE LAPAROTOMY T 102X78X121 (DRAPES) ×3 IMPLANT
ELECT REM PT RETURN 9FT ADLT (ELECTROSURGICAL) ×3
ELECTRODE REM PT RTRN 9FT ADLT (ELECTROSURGICAL) ×1 IMPLANT
GLOVE BIO SURGEON STRL SZ7.5 (GLOVE) IMPLANT
GLOVE BIOGEL M 6.5 STRL (GLOVE) ×6 IMPLANT
GLOVE BIOGEL M 7.0 STRL (GLOVE) ×3 IMPLANT
GLOVE INDICATOR 8.0 STRL GRN (GLOVE) IMPLANT
GOWN STRL REUS W/ TWL LRG LVL3 (GOWN DISPOSABLE) ×3 IMPLANT
GOWN STRL REUS W/TWL LRG LVL3 (GOWN DISPOSABLE) ×6
KIT RM TURNOVER STRD PROC AR (KITS) ×3 IMPLANT
LIQUID BAND (GAUZE/BANDAGES/DRESSINGS) ×3 IMPLANT
NDL SAFETY 22GX1.5 (NEEDLE) ×3 IMPLANT
PACK BASIN MINOR ARMC (MISCELLANEOUS) ×3 IMPLANT
SUT MNCRL AB 4-0 PS2 18 (SUTURE) ×3 IMPLANT
SUT VIC AB 2-0 SH 27 (SUTURE) ×4
SUT VIC AB 2-0 SH 27XBRD (SUTURE) ×2 IMPLANT
SUT VIC AB 3-0 SH 27 (SUTURE) ×2
SUT VIC AB 3-0 SH 27X BRD (SUTURE) ×1 IMPLANT
SYRINGE 10CC LL (SYRINGE) ×3 IMPLANT
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
## 2017-01-06 NOTE — Progress Notes (Signed)
Tramadol not adequate for pain. Will rx w/ norco and phenergan.

## 2017-01-06 NOTE — Interval H&P Note (Signed)
History and Physical Interval Note:Core biopsy confirmed endometrioma. Given the severe painnassociated with this excision recommended and pt is agreeable. 01/06/2017 8:48 AM  Deirdre PeerHolly S Linsley  has presented today for surgery, with the diagnosis of mass abdominal wall  The various methods of treatment have been discussed with the patient and family. After consideration of risks, benefits and other options for treatment, the patient has consented to  Procedure(s): EXCISION OF ABDOMINAL WALL MASS (N/A) as a surgical intervention .  The patient's history has been reviewed, patient examined, no change in status, stable for surgery.  I have reviewed the patient's chart and labs.  Questions were answered to the patient's satisfaction.     Leimomi Zervas G

## 2017-01-06 NOTE — Anesthesia Post-op Follow-up Note (Cosign Needed)
Anesthesia QCDR form completed.        

## 2017-01-06 NOTE — H&P (View-Only) (Signed)
Patient ID: Kaitlin Phillips, female   DOB: 18-Oct-1986, 31 y.o.   MRN: [id number]  Chief Complaint  Patient presents with  . Mass    HPI Kaitlin Phillips is a 31 y.o. female here today to discuss abdominal wall mass. MRI of abdomen on  12/24/2016. States that pain in area is worse than during last visit and more tender.  I have reviewed the history of present illness with the patient.   HPI  Past Medical History:  Diagnosis Date  . Migraine headache   . Seizure [facility])     Past Surgical History:  Procedure Laterality Date  . CESAREAN SECTION  09/08/13   [facility]  . TUBAL LIGATION  09/2013   [facility]    Family History  Problem Relation Age of Onset  . Hypertension Mother   . Depression Sister   . Lung cancer Father     Social History Social History  Substance Use Topics  . Smoking status: Current Every Day Smoker    Packs/day: 1.00    Types: Cigarettes  . Smokeless tobacco: Never Used  . Alcohol use Yes    Allergies  Allergen Reactions  . Amitriptyline Anaphylaxis  . Penicillins Shortness Of Breath, Swelling and Hives  . Dilantin  [Phenytoin Sodium Extended] Itching  . Vicodin  [Hydrocodone-Acetaminophen] Nausea Only and Nausea And Vomiting    Current Outpatient Prescriptions  Medication Sig Dispense Refill  . ibuprofen (ADVIL,MOTRIN) 800 MG tablet Take 800 mg by mouth every 8 (eight) hours as needed.    Marland Kitchen levonorgestrel (MIRENA) 20 MCG/24HR IUD 1 each by Intrauterine route once.     No current facility-administered medications for this visit.     Review of Systems Review of Systems  Constitutional: Negative.   Respiratory: Negative.   Cardiovascular: Negative.     Blood pressure 126/74, pulse 80, resp. rate 12, height 5\' 5"  (1.651 m), weight 217 lb (98.4 kg).  Physical Exam Physical Exam  Data Reviewed Prior notes, ultrasound, and MRI. MRI showed a mass in the left lower abdominal area.   Assessment    Core biopsy performed with patient consent.    Plan    Procedure Note: CORE BIOPSY REPORT  Name:  Kaitlin Phillips DOB:  1986/12/08  Vital signs:BP 126/74   Pulse 80   Resp 12   Ht 5\' 5"  (1.651 m)   Wt 217 lb (98.4 kg)   BMI 36.11 kg/m   Lesion:  Mass abdominal wall left lower quadrant   Local anesthetic:   10 ml of 1% Xylocaine/0.5% Marcaine  Prep: Chloraprep Solution  Device:  14 Gauge Bard    Ultrasound guidance:  Used  Patient tolerance: Patient tolerated the procedure well  Approach: LM  Clip: None  Dressing: Steristrips telfa tegaderm  Ice pack applied. Written instructions provided to patient regarding wound care.   Patient advised that patient will be contacted by phone when pathology report is available.  Given the severe pain patient has associated with this mass, which is located into the muscle area. Excision would be most appropriate for relief of pain. More than likely, this is endometriosis given the pain associated with it. If pathology suggests a tumor that requires more radical resection, we can plan accordingly. Patient is agreeable to this.    [facility] G       The patient is scheduled for surgery at [facility] on 01/06/17. She will pre admit by phone. The patient is aware of date and instructions.   This information has  been scribed by Ples SpecterJessica Qualls CMA.    Imad Shostak G 12/30/2016, 3:21 PM

## 2017-01-06 NOTE — Anesthesia Procedure Notes (Signed)
Procedure Name: Intubation Date/Time: 01/06/2017 9:35 AM Performed by: Doreen Salvage Pre-anesthesia Checklist: Patient identified, Patient being monitored, Timeout performed, Emergency Drugs available and Suction available Patient Re-evaluated:Patient Re-evaluated prior to inductionOxygen Delivery Method: Circle system utilized Preoxygenation: Pre-oxygenation with 100% oxygen Intubation Type: IV induction Ventilation: Mask ventilation without difficulty Laryngoscope Size: Mac, McGraph, 4 and 3 (DL with MAC 3v x2. DL with Mcgrath MAC 4 x1) Grade View: Grade I Tube type: Oral Tube size: 7.0 mm Number of attempts: 3 Airway Equipment and Method: Stylet and Bougie stylet Placement Confirmation: ETT inserted through vocal cords under direct vision,  positive ETCO2 and breath sounds checked- equal and bilateral Secured at: 21 cm Tube secured with: Tape Dental Injury: Teeth and Oropharynx as per pre-operative assessment  Difficulty Due To: Difficult Airway- due to anterior larynx Comments: Grade 3 view with DL with MAC 3. Grade 1 view with Meredith Staggers

## 2017-01-06 NOTE — Interval H&P Note (Signed)
History and Physical Interval Note:  01/06/2017 8:47 AM  Kaitlin Phillips  has presented today for surgery, with the diagnosis of mass abdominal wall  The various methods of treatment have been discussed with the patient and family. After consideration of risks, benefits and other options for treatment, the patient has consented to  Procedure(s): EXCISION OF ABDOMINAL WALL MASS (N/A) as a surgical intervention .  The patient's history has been reviewed, patient examined, no change in status, stable for surgery.  I have reviewed the patient's chart and labs.  Questions were answered to the patient's satisfaction.     Leshia Kope G

## 2017-01-06 NOTE — Discharge Instructions (Signed)

## 2017-01-06 NOTE — Op Note (Signed)
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 G. Sankar   Assistant:     Anesthesia: Gen.  Complications: None  EBL:  less than 10 mL  Drains: None  Description: Patient was put to sleep in supine position. The left lower abdominal area was then required with the ultrasound and the mass in question the deep subcutaneous plane overlying the fascia was identified and marked on the skin a transverse incision was mapped accordingly directly overlying the area of the mass. The abdomen was then prepped and draped as sterile field. Timeout was performed. 0.5% Marcaine totaling 30 mL was instilled around the planned incision for postop analgesia. Skin incision was then made and deepened through the subcutaneous tissue into the deep subcutaneous plane. With the use of cautery for control of bleeding and then dissected the deeply seated palpable mass was identified. This was located in the deep subcutaneous plane overlying the fascia. With careful finger palpation circumferential dissection was completed and the mass removed in entirety. Measurement of the excised the mass was a 5 x 4 x 3 cm. This was sent in formalin for pathology. After ensuring hemostasis the deepest layers were closed with interrupted 2-0 Vicryl. Subcutaneous tissue closed with running 3-0 Vicryl. Skin was closed with subcuticular 4-0 Monocryl and covered with Dermabond. Procedure was well-tolerated and patient subsequently extubated and returned recovery room stable condition.

## 2017-01-06 NOTE — H&P (View-Only) (Signed)
Patient ID: Kaitlin Phillips, female   DOB: 03/24/1986, 31 y.o.   MRN: [id number]  Chief Complaint  Patient presents with  . Abdominal Pain    HPI Kaitlin Phillips is a 31 y.o. female here for assessment of left lower quadrant pain and possible hernia. She reports she has had pain and discomfort with this area for 3 years. It has increased significantly in the past 4 months. Describes the pain as a burning sensation, and states that she experiences diaphoresis, and dizziness upon standing occasionally with the pain. The area does bulge out. She does have a history of 2 cesarian sections. She is scheduled for an MRI of the abdomen on 12/24/16. She is here today with her mother, Kaitlin Phillips.  I have reviewed the history of present illness with the patient.  HPI  Past Medical History:  Diagnosis Date  . Migraine headache   . Seizure [facility])     Past Surgical History:  Procedure Laterality Date  . CESAREAN SECTION  09/08/13   [facility]  . TUBAL LIGATION  09/2013   [facility]    Family History  Problem Relation Age of Onset  . Hypertension Mother   . Depression Sister   . Lung cancer Father     Social History Social History  Substance Use Topics  . Smoking status: Current Every Day Smoker    Packs/day: 1.00    Types: Cigarettes  . Smokeless tobacco: Never Used  . Alcohol use Yes    Allergies  Allergen Reactions  . Amitriptyline Anaphylaxis  . Penicillins Shortness Of Breath, Swelling and Hives  . Dilantin  [Phenytoin Sodium Extended] Itching  . Vicodin  [Hydrocodone-Acetaminophen] Nausea Only and Nausea And Vomiting    Current Outpatient Prescriptions  Medication Sig Dispense Refill  . ibuprofen (ADVIL,MOTRIN) 800 MG tablet Take 800 mg by mouth every 8 (eight) hours as needed.    Marland Kitchen. levonorgestrel (MIRENA) 20 MCG/24HR IUD 1 each by Intrauterine route once.     No current facility-administered medications for this visit.     Review of Systems Review of Systems  Constitutional:  Negative.   Respiratory: Negative.   Cardiovascular: Negative.     Blood pressure 120/70, pulse 82, resp. rate 12, height 5\' 5"  (1.651 Phillips), weight 215 lb (97.5 kg).  Physical Exam Physical Exam  Constitutional: She is oriented to person, place, and time. She appears well-developed and well-nourished.  Eyes: Conjunctivae are normal. No scleral icterus.  Neck: Neck supple.  Cardiovascular: Normal rate, regular rhythm and normal heart sounds.   Pulmonary/Chest: Effort normal and breath sounds normal.  Abdominal: Soft. Normal appearance and bowel sounds are normal. There is tenderness in the left lower quadrant.    Lymphadenopathy:    She has no cervical adenopathy.  Neurological: She is alert and oriented to person, place, and time.  Skin: Skin is warm and dry.  Psychiatric: She has a normal mood and affect.    Data Reviewed Data reviewed.   Assessment    Abdominal ultrasound performed.  1-2 cm hypoechoic irregular poorly defined area identified within the muscle of the abdomen at the patient's most tender spot. Suspected endometriosis, need MRI results to confirm.    Plan    MRI of abdomen scheduled for 12/24/2016.  Follow-up after MRI to discuss results and plan.    This has been scribed by Kaitlin Phillips    Kaitlin Phillips, Kaitlin Phillips 12/22/2016, 11:03 AM

## 2017-01-06 NOTE — Transfer of Care (Signed)
Immediate Anesthesia Transfer of Care Note  Patient: Kaitlin Phillips  Procedure(s) Performed: Procedure(s): EXCISION OF ABDOMINAL WALL MASS (N/A)  Patient Location: PACU  Anesthesia Type:General  Level of Consciousness: sedated  Airway & Oxygen Therapy: Patient Spontanous Breathing and Patient connected to face mask oxygen  Post-op Assessment: Report given to RN and Post -op Vital signs reviewed and stable  Post vital signs: Reviewed and stable  Last Vitals:  Vitals:   01/06/17 0911 01/06/17 1059  BP: (!) 127/99 121/71  Pulse: 78 91  Resp: 18 11  Temp: 36.4 C     Complications: No apparent anesthesia complications

## 2017-01-07 ENCOUNTER — Encounter: Payer: Self-pay | Admitting: General Surgery

## 2017-01-07 LAB — SURGICAL PATHOLOGY

## 2017-01-07 NOTE — Anesthesia Postprocedure Evaluation (Signed)
Anesthesia Post Note  Patient: Kaitlin Phillips  Procedure(s) Performed: Procedure(s) (LRB): EXCISION OF ABDOMINAL WALL MASS (N/A)  Patient location during evaluation: PACU Anesthesia Type: General Level of consciousness: awake and alert and oriented Pain management: pain level controlled Vital Signs Assessment: post-procedure vital signs reviewed and stable Respiratory status: spontaneous breathing Cardiovascular status: blood pressure returned to baseline Anesthetic complications: no     Last Vitals:  Vitals:   01/06/17 1227 01/06/17 1240  BP: 123/74 119/78  Pulse: 68 65  Resp: 16 16  Temp: 36.5 C 36.2 C    Last Pain:  Vitals:   01/07/17 1633  TempSrc:   PainSc: 0-No pain                 Kenard Morawski

## 2017-01-14 ENCOUNTER — Encounter: Payer: Self-pay | Admitting: General Surgery

## 2017-01-14 ENCOUNTER — Ambulatory Visit (INDEPENDENT_AMBULATORY_CARE_PROVIDER_SITE_OTHER): Payer: Managed Care, Other (non HMO) | Admitting: General Surgery

## 2017-01-14 VITALS — BP 118/72 | HR 78 | Resp 12 | Ht 65.0 in | Wt 211.0 lb

## 2017-01-14 DIAGNOSIS — R1904 Left lower quadrant abdominal swelling, mass and lump: Secondary | ICD-10-CM

## 2017-01-14 NOTE — Patient Instructions (Addendum)
Follow-up as needed.  Call back with any questions/concerns.

## 2017-01-14 NOTE — Progress Notes (Signed)
Patient ID: Kaitlin Phillips, female   DOB: 10/26/1986, 31 y.o.   MRN: [id number]  Chief Complaint  Patient presents with  . Routine Post Op    HPI Kaitlin CrimesHolly S Phillips is a 31 y.o. female.  Here today for postoperative visit, abdominal wall mass, path showed endometriosis. She states she is doing much better, still sore. I have reviewed the history of present illness with the patient.   HPI  Past Medical History:  Diagnosis Date  . Anemia    h/o years ago  . GERD (gastroesophageal reflux disease)    takes apple cider vinegar daily and has no problems with GERD  . Migraine headache   . Seizure (HCC)    last seizure in 2008    Past Surgical History:  Procedure Laterality Date  . CESAREAN SECTION  09/08/2013   [facility]- x2  . EXCISION OF ABDOMINAL WALL TUMOR N/A 01/06/2017   ENDOMETRIOSIS./ EXCISION OF ABDOMINAL WALL MASS;  Surgeon: Kieth BrightlySeeplaputhur G Gabriele Zwilling, MD;  Location: [facility] ORS;  Service: General;  Laterality: N/A;  . FACIAL FRACTURE SURGERY  2000  . LAPAROSCOPIC ENDOMETRIOSIS FULGURATION    . TONSILLECTOMY     and tubes in ears  . TUBAL LIGATION  09/2013   [facility]  . WISDOM TOOTH EXTRACTION      Family History  Problem Relation Age of Onset  . Hypertension Mother   . Depression Sister   . Lung cancer Father     Social History Social History  Substance Use Topics  . Smoking status: Current Every Day Smoker    Packs/day: 1.00    Years: 7.00    Types: Cigarettes  . Smokeless tobacco: Never Used  . Alcohol use Yes     Comment: rare    Allergies  Allergen Reactions  . Amitriptyline Anaphylaxis  . Amoxicillin Anaphylaxis  . Penicillins Hives, Shortness Of Breath and Swelling    ANY MEDS WITHIN THE "CILLIN"  FAMILY   . Dilantin  [Phenytoin Sodium Extended] Itching  . Vicodin  [Hydrocodone-Acetaminophen] Nausea Only and Nausea And Vomiting    Current Outpatient Prescriptions  Medication Sig Dispense Refill  . HYDROcodone-acetaminophen (NORCO) 5-325 MG tablet Take 1-2  tablets by mouth every 4 (four) hours as needed for moderate pain. 30 tablet 0  . ibuprofen (ADVIL,MOTRIN) 800 MG tablet Take 800 mg by mouth every 8 (eight) hours as needed.    Marland Kitchen. levonorgestrel (MIRENA) 20 MCG/24HR IUD 1 each by Intrauterine route once.    . Multiple Vitamins-Minerals (AIRBORNE PO) Take by mouth as needed.    Marland Kitchen. Phenylephrine-Aspirin (ALKA-SELTZER PLUS SINUS PO) Take 1 tablet by mouth as needed.    . promethazine (PHENERGAN) 12.5 MG tablet Take 1 tablet (12.5 mg total) by mouth every 4 (four) hours as needed for nausea or vomiting. 20 tablet 0   No current facility-administered medications for this visit.     Review of Systems Review of Systems  Constitutional: Negative.   Respiratory: Negative.   Cardiovascular: Negative.     Blood pressure 118/72, pulse 78, resp. rate 12, height 5\' 5"  (1.651 m), weight 211 lb (95.7 kg).  Physical Exam Physical Exam  Constitutional: She is oriented to person, place, and time. She appears well-developed and well-nourished.  Abdominal:    Neurological: She is alert and oriented to person, place, and time.  Skin: Skin is warm and dry.  Psychiatric: Her behavior is normal.    Data Reviewed Pathology and prior notes  Assessment    Stable postoperative exam. S/p  excision of LLQ abdominal wall mass. Pathology showed endometriosis.     Plan   Recommend she follow up with her gynecologist-Dr. Jean Rosenthal Follow-up as needed.  Call back with any questions/concerns.     This information has been scribed by Dorathy Daft RN, BSN,BC.    Charlet Harr G 01/14/2017, 1:30 PM

## 2017-02-09 ENCOUNTER — Ambulatory Visit (INDEPENDENT_AMBULATORY_CARE_PROVIDER_SITE_OTHER): Payer: Managed Care, Other (non HMO) | Admitting: Obstetrics and Gynecology

## 2017-02-09 ENCOUNTER — Encounter: Payer: Self-pay | Admitting: Obstetrics and Gynecology

## 2017-02-09 VITALS — BP 118/74 | Ht 65.0 in | Wt 216.0 lb

## 2017-02-09 DIAGNOSIS — E6609 Other obesity due to excess calories: Secondary | ICD-10-CM

## 2017-02-09 DIAGNOSIS — Z124 Encounter for screening for malignant neoplasm of cervix: Secondary | ICD-10-CM | POA: Diagnosis not present

## 2017-02-09 DIAGNOSIS — Z8041 Family history of malignant neoplasm of ovary: Secondary | ICD-10-CM | POA: Diagnosis not present

## 2017-02-09 DIAGNOSIS — Z01419 Encounter for gynecological examination (general) (routine) without abnormal findings: Secondary | ICD-10-CM

## 2017-02-09 DIAGNOSIS — N806 Endometriosis in cutaneous scar: Secondary | ICD-10-CM | POA: Insufficient documentation

## 2017-02-09 DIAGNOSIS — Z803 Family history of malignant neoplasm of breast: Secondary | ICD-10-CM | POA: Insufficient documentation

## 2017-02-09 DIAGNOSIS — Z6835 Body mass index (BMI) 35.0-35.9, adult: Secondary | ICD-10-CM | POA: Diagnosis not present

## 2017-02-09 NOTE — Assessment & Plan Note (Signed)
Discussed management options, including do nothing at this time versus suppression with progesterone.  Discussed pros/cons of each.  She elects to take no suppressive medication at this time.

## 2017-02-09 NOTE — Assessment & Plan Note (Signed)
See discussion of Family History of Breast Cancer

## 2017-02-09 NOTE — Assessment & Plan Note (Signed)
Discussed her family history in detail.  She may be at elevated risk. She will discuss with her mother regarding genetic testing for mutation.  She may be a candidate herself for testing.  Will continue to follow up.

## 2017-02-09 NOTE — Progress Notes (Addendum)
Gynecology Annual Exam  PCP: No PCP Per Patient  Chief Complaint:  Chief Complaint  Patient presents with  . Annual Exam    History of Present Illness: Patient is a 31 y.o. [id number] presents for annual exam. The patient has no complaints today.  She has no breast, bowel, bladder, or sexual dysfunction complaints.  She continues to menstruate irregularly due to the IUD without complaints.   Current contraception is:  Mirena IUD (placed 3 years ago 02/2014) She is sexually active with her husband and has no complaints. Last pap smear 02/2013: nilm.  She underwent surgery at the end of January for removal of subcutaneous endometriosis. She has no current pain or other complaints.   The patient wears seatbelts: yes.    The patient has regular exercise: yes.  The patient has ever been transfused or tattooed?: yes.  The patient reports that domestic violence in her life is absent.    Discussed family history of cancer.  Her mother had breast and ovarian cancer. Her maternal grandmother had ovarian cancer.  Review of Systems: Review of Systems  Constitutional: Negative.   HENT: Negative.   Eyes: Negative.   Respiratory: Negative.   Cardiovascular: Negative.   Gastrointestinal: Negative.   Genitourinary: Negative.   Musculoskeletal: Negative.   Skin: Negative.   Neurological: Negative.   Psychiatric/Behavioral: Negative.    Past Medical History:  Diagnosis Date  . Anemia    h/o years ago  . 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 GERD (gastroesophageal reflux disease)    takes apple cider vinegar daily and has no problems with GERD  . Migraine headache   . Seizure (HCC)    last seizure in 2008    Past Surgical History:  Procedure Laterality Date  . CESAREAN SECTION  09/08/2013   [facility]- x2  . EXCISION OF ABDOMINAL WALL TUMOR N/A 01/06/2017   ENDOMETRIOSIS./ EXCISION OF ABDOMINAL WALL MASS;  Surgeon: Kieth Brightly, MD;  Location: [facility] ORS;  Service: General;  Laterality: N/A;   . FACIAL FRACTURE SURGERY  2000  . LAPAROSCOPIC ENDOMETRIOSIS FULGURATION    . TONSILLECTOMY     and tubes in ears  . TUBAL LIGATION  09/2013   [facility]  . WISDOM TOOTH EXTRACTION      Medications:   Medication Sig Start Date End Date Taking? Authorizing Provider  levonorgestrel (MIRENA) 20 MCG/24HR IUD 1 each by Intrauterine route once.    Historical Provider, MD  Phenylephrine-Aspirin (ALKA-SELTZER PLUS SINUS PO) Take 1 tablet by mouth as needed.    Historical Provider, MD   Allergies  Allergen Reactions  . Amitriptyline Anaphylaxis  . Amoxicillin Anaphylaxis  . Penicillins Hives, Shortness Of Breath and Swelling    ANY MEDS WITHIN THE "CILLIN"  FAMILY   . Dilantin  [Phenytoin Sodium Extended] Itching  . Vicodin  [Hydrocodone-Acetaminophen] Nausea Only and Nausea And Vomiting    Gynecologic History: Patient's last menstrual period was 02/05/2017.  Obstetric History: G2P2002, s/p C-section x 2  Social History   Social History  . Marital status: Married    Spouse name: N/A  . Number of children: N/A  . Years of education: N/A   Occupational History  . Not on file.   Social History Main Topics  . Smoking status: Current Every Day Smoker    Packs/day: 1.00    Years: 7.00    Types: Cigarettes  . Smokeless tobacco: Never Used  . Alcohol use Yes     Comment: rare  . Drug use: No  .  Sexual activity: Yes    Birth control/ protection: IUD   Other Topics Concern  . Not on file   Social History Narrative  . No narrative on file   Family History  Problem Relation Age of Onset  . Hypertension Mother   . Breast cancer Mother   . Ovarian cancer Mother   . Depression Sister   . Lung cancer Father   . Ovarian cancer Maternal Grandmother    Physical Exam BP 118/74   Ht 5\' 5"  (1.651 m)   Wt 216 lb (98 kg)   LMP 02/05/2017   BMI 35.94 kg/m   General: NAD HEENT: normocephalic, anicteric Thyroid: no enlargement, no palpable nodules Pulmonary: No increased work of  breathing, CTAB Cardiovascular: RRR, distal pulses 2+ Breast: Breast symmetrical, no tenderness, no palpable nodules or masses, no skin or nipple retraction present, no nipple discharge.  No axillary or supraclavicular lymphadenopathy. Abdomen: NABS, soft, non-tender, non-distended.  Umbilicus without lesions.  No hepatomegaly, splenomegaly or masses palpable. No evidence of hernia. Recent incision site without erythema, induration, warmth, and tenderness. Genitourinary:  External: Normal external female genitalia.  Normal urethral meatus, normal Bartholin's and Skene's glands.    Vagina: Normal vaginal mucosa, no evidence of prolapse.    Cervix: Grossly normal in appearance, no bleeding, IUD strings visible  Uterus: Non-enlarged, mobile, normal contour.  No CMT  Adnexa: ovaries non-enlarged, no adnexal masses  Rectal: deferred  Lymphatic: no evidence of inguinal lymphadenopathy Extremities: no edema, erythema, or tenderness Neurologic: Grossly intact Psychiatric: mood appropriate, affect full  Female chaperone present for pelvic and breast  portions of the physical exam  Results: Audit questionnaire: 1 PHQ-9: 7  Assessment: 31 y.o. [id number] here for routine annual exam.  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  Discussed pros/cons of each.  She elects to take no suppressive medication at this time.      Family history of ovarian cancer    See discussion of Family History of Breast Cancer      Family history of breast cancer    Discussed her family history in detail.  She may be at elevated risk. She will discuss with her mother regarding genetic testing for mutation.  She may be a candidate herself for testing.  Will continue to follow up.        Other Visit Diagnoses    Women's annual routine gynecological examination    -  Primary    Relevant Orders   Pap IG and HPV (high risk) DNA detection   Pap smear for cervical cancer screening       Relevant Orders   Pap IG and HPV (high risk) DNA detection   Class 2 obesity due to excess calories without serious comorbidity with body mass index (BMI) of 35.0 to 35.9 in adult         1) Contraception Education given regarding options for contraception, including contiuing IUD for now and replace when expired.Marland Kitchen.  2) STI screening was offered. Declined.  3) Pap - ASCCP guidelines and rational discussed.  Patient opts for routine screening interval. Performed today.  4) Routine healthcare maintenance including cholesterol, diabetes screening not due at this time.  5) obesity: she has lost 60 pounds. She is continuing to lose weight. Will continue to monitor.   5) Follow up 1 year for routine annual exam  Thomasene MohairStephen Heavenly Christine,  MD 02/09/2017 3:37 PM

## 2017-02-09 NOTE — Patient Instructions (Signed)
Discuss with mother if she has had testing for BRCA gene mutation, Lynch Syndrome mutation, or any other genetic mutations that confer a higher risk of breast and/or ovarian cancer

## 2017-02-17 ENCOUNTER — Encounter: Payer: Self-pay | Admitting: Obstetrics and Gynecology

## 2017-02-25 ENCOUNTER — Encounter: Payer: Self-pay | Admitting: Obstetrics and Gynecology

## 2017-02-25 LAB — PAP IG AND HPV HIGH-RISK
HPV, HIGH-RISK: NEGATIVE
PAP Smear Comment: 0

## 2017-06-23 ENCOUNTER — Telehealth: Payer: Self-pay

## 2017-06-23 NOTE — Telephone Encounter (Signed)
Pt called c/o severe lower abdominal pain.  Has endometriosis.  Had surg for endometriosis 2/18.  SDJ told her to call if had problems again b/c he was concerned she would have problems again and he wanted to see me really fast if problems again.  [phone]

## 2017-06-23 NOTE — Telephone Encounter (Signed)
Please work her in when possible. Thank you

## 2017-06-24 NOTE — Telephone Encounter (Signed)
Patient is schedule with Dr. Bonney AidStaebler 06/25/17

## 2017-06-25 ENCOUNTER — Ambulatory Visit (INDEPENDENT_AMBULATORY_CARE_PROVIDER_SITE_OTHER): Payer: Managed Care, Other (non HMO) | Admitting: Obstetrics and Gynecology

## 2017-06-25 ENCOUNTER — Encounter: Payer: Self-pay | Admitting: Obstetrics and Gynecology

## 2017-06-25 VITALS — BP 102/72 | HR 102 | Ht 65.0 in | Wt 220.0 lb

## 2017-06-25 DIAGNOSIS — G8929 Other chronic pain: Secondary | ICD-10-CM | POA: Diagnosis not present

## 2017-06-25 DIAGNOSIS — R102 Pelvic and perineal pain: Secondary | ICD-10-CM

## 2017-06-25 DIAGNOSIS — N806 Endometriosis in cutaneous scar: Secondary | ICD-10-CM

## 2017-06-25 MED ORDER — NORETHINDRONE ACETATE 5 MG PO TABS: 5.0000 mg | ORAL_TABLET | Freq: Every day | ORAL | 11 refills | Status: DC

## 2017-06-25 NOTE — Progress Notes (Addendum)
Obstetrics & Gynecology Office Visit   Chief Complaint:  Chief Complaint  Patient presents with  . Follow-up    endometriosis flare up/abdominal pain is bad    History of Present Illness: 31 year old G2P2002 with a history notable for C-section scarring endometrioma with resection of a soft tissue mass by Dr. Evette CristalSankar in the anterior abdominal wall on 01/06/2017 by Dr. Evette CristalSankar, pathology confirming endometriosis.  She is relying on BTL for contraception, and Mirena for cycle control.  She feel her abdominal pain, BLQ/suprapubic similar to her initial presentation which has worsened over the past month, she presents primarily to discuss next steps.  Per her prior conversations with Dr. Jean RosenthalJackson the possibility of hysterectomy had been raised.  No fevers, no chills, no GI symptoms such as nausea, diarrhea, changes in bowl movement.  No other notable GU symptoms.   Review of Systems: 10 point review of systems negative unless otherwise noted in HPI  Past Medical History:  Past Medical History:  Diagnosis Date  . Anemia    h/o years ago  . 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 GERD (gastroesophageal reflux disease)    takes apple cider vinegar daily and has no problems with GERD  . Migraine headache   . Seizure (HCC)    last seizure in 2008    Past Surgical History:  Past Surgical History:  Procedure Laterality Date  . CESAREAN SECTION  09/08/2013   [facility]- x2  . EXCISION OF ABDOMINAL WALL TUMOR N/A 01/06/2017   ENDOMETRIOSIS./ EXCISION OF ABDOMINAL WALL MASS;  Surgeon: Kieth BrightlySeeplaputhur G Sankar, MD;  Location: [facility] ORS;  Service: General;  Laterality: N/A;  . FACIAL FRACTURE SURGERY  2000  . LAPAROSCOPIC ENDOMETRIOSIS FULGURATION    . TONSILLECTOMY     and tubes in ears  . TUBAL LIGATION  09/2013   [facility]  . WISDOM TOOTH EXTRACTION      Gynecologic History: No LMP recorded. Patient is not currently having periods (Reason: IUD).  Obstetric History: [id number]  Family History:    Family History  Problem Relation Age of Onset  . Hypertension Mother   . Breast cancer Mother [id number]  . Ovarian cancer Mother [id number]  . Depression Sister   . Lung cancer Father        deceased  . Ovarian cancer Maternal Grandmother     Social History:  Social History   Social History  . Marital status: Married    Spouse name: N/A  . Number of children: N/A  . Years of education: N/A   Occupational History  . Not on file.   Social History Main Topics  . Smoking status: Current Every Day Smoker    Packs/day: 1.00    Years: 7.00    Types: Cigarettes  . Smokeless tobacco: Never Used  . Alcohol use Yes     Comment: rare  . Drug use: No  . Sexual activity: Yes    Birth control/ protection: IUD   Other Topics Concern  . Not on file   Social History Narrative  . No narrative on file    Allergies:  Allergies  Allergen Reactions  . Amitriptyline Anaphylaxis  . Amoxicillin Anaphylaxis  . Penicillins Hives, Shortness Of Breath and Swelling    ANY MEDS WITHIN THE "CILLIN"  FAMILY   . Dilantin  [Phenytoin Sodium Extended] Itching  . Vicodin  [Hydrocodone-Acetaminophen] Nausea Only and Nausea And Vomiting    Medications: Prior to Admission medications   Medication Sig Start Date  End Date Taking? Authorizing Provider  levonorgestrel (MIRENA) 20 MCG/24HR IUD 1 each by Intrauterine route once.   Yes [provider]  norethindrone (AYGESTIN) 5 MG tablet Take 1 tablet (5 mg total) by mouth daily. 06/25/17   Vena Austria, MD    Physical Exam Vitals:  Vitals:   06/25/17 1329  BP: 102/72  Pulse: (!) 102   No LMP recorded. Patient is not currently having periods (Reason: IUD).  General: NAD HEENT: normocephalic, anicteric Pulmonary: No increased work of breathing Abdomen: NABS, soft, non-tender, non-distended.  Umbilicus without lesions.  No hepatomegaly, splenomegaly or masses palpable. No evidence of hernia.  Prior incision well healed no  masses Genitourinary:  External: Normal external female genitalia.  Normal urethral meatus, normal Bartholin's and Skene's glands.    Vagina: Normal vaginal mucosa, no evidence of prolapse.    Cervix: Grossly normal in appearance, no bleeding  Uterus: Non-enlarged, mobile, normal contour.  No CMT  Adnexa: ovaries non-enlarged, no adnexal masses  Rectal: deferred  Lymphatic: no evidence of inguinal lymphadenopathy Extremities: no edema, erythema, or tenderness Neurologic: Grossly intact Psychiatric: mood appropriate, affect full  Female chaperone present for pelvic portions of the physical exam  Assessment: 31 y.o. [id number] chronic pelvic pain, endometriosis  Plan: Problem List Items Addressed This Visit    None    Visit Diagnoses    Chronic pelvic pain in female    -  Primary   Endometriosis in scar         - We had a long discussion regarding endometriosis, and the fact that C-section scar endometriosis involving the actual pfannenstiel incision are relatively rare without much data beyond case reports.  BTL is generally though to be protective against the progression or worsening of peritoneal endometriotic implants, as is Mirena IUD.  This is mainly because these contraceptive options prevent retrograde menstruation.  In the case of subcutaneous endometrios implants the pathogenesis is likely seeding of the scar at the time of C-section vs localized metaplasia.  Her IUD and BTL will not in anyway alter or control any remaining implants which may be present in the subcutaneous tissue.  A hysterectomy, unless also coupled with a oophorectomy also would also not necessarily alter her disease course.  We discussed use of systemic progestin to stabilize and over time hopefully shrink any remaining subcutaneous implants.  In addition a trial of depo leupron may also be reasonable depending on her response to po norethindrone.    -Start norethindrone - Discussed addition of leupron and or  gabapentin pending response - Assess response in 3 months - A total of 15 minutes were spent in face-to-face contact with the patient during this encounter with over half of that time devoted to counseling and coordination of care.

## 2017-07-08 ENCOUNTER — Ambulatory Visit: Payer: Managed Care, Other (non HMO) | Admitting: Obstetrics and Gynecology

## 2017-08-18 ENCOUNTER — Ambulatory Visit (INDEPENDENT_AMBULATORY_CARE_PROVIDER_SITE_OTHER): Payer: 59 | Admitting: Physician Assistant

## 2017-08-18 ENCOUNTER — Encounter: Payer: Self-pay | Admitting: Physician Assistant

## 2017-08-18 VITALS — BP 124/64 | HR 88 | Temp 98.3°F | Resp 16 | Wt 206.0 lb

## 2017-08-18 DIAGNOSIS — J0110 Acute frontal sinusitis, unspecified: Secondary | ICD-10-CM | POA: Diagnosis not present

## 2017-08-18 MED ORDER — DOXYCYCLINE HYCLATE 100 MG PO TABS: 100.0000 mg | ORAL_TABLET | Freq: Two times a day (BID) | ORAL | 0 refills | Status: AC

## 2017-08-18 MED ORDER — BENZONATATE 100 MG PO CAPS: 100.0000 mg | ORAL_CAPSULE | Freq: Three times a day (TID) | ORAL | 0 refills | Status: AC

## 2017-08-18 NOTE — Progress Notes (Signed)
[facility]  Chief Complaint  Patient presents with  . URI    Started Saturday  . Sinusitis    Subjective:    Patient ID: Kaitlin Phillips, female    DOB: [birth year], 31 y.o.   MRN: [id number]  Upper Respiratory Infection: Kaitlin Phillips is a 31 y.o. female with a past medical history significant for 1 PPD smoking  complaining of symptoms of a URI, possible sinusitis. Symptoms include congestion, cough and swollen glands. Onset of symptoms was 3 days ago, severely worsening since that time. She also c/o congestion, nasal congestion, post nasal drip and productive cough with  clear colored sputum for the past 3 days . Endorses purulent rhinorrhea She is drinking plenty of fluids. Evaluation to date: none. Treatment to date: cough suppressants and decongestants. The treatment has provided minimal.   Review of Systems  HENT: Positive for congestion, ear discharge, postnasal drip, rhinorrhea, sinus pain, sinus pressure and sore throat. Negative for ear pain, nosebleeds, sneezing and tinnitus.   Eyes: Positive for itching. Negative for photophobia, pain, discharge, redness and visual disturbance.  Respiratory: Positive for cough, chest tightness and shortness of breath. Negative for apnea, choking, wheezing and stridor.   Gastrointestinal: Positive for diarrhea and nausea. Negative for abdominal distention, abdominal pain, anal bleeding, blood in stool, constipation, rectal pain and vomiting.  Neurological: Positive for light-headedness and headaches. Negative for dizziness.       Objective:   BP 124/64 (BP Location: Right Arm, Patient Position: Sitting, Cuff Size: Large)   Pulse 88   Temp 98.3 F (36.8 C) (Oral)   Resp 16   Wt 206 lb (93.4 kg)   BMI 34.28 kg/m   Patient Active Problem List   Diagnosis Date Noted  . Family history of ovarian cancer 02/09/2017  . Family history of breast cancer 02/09/2017  . 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  History of migraine headaches 11/19/2015  . History of seizures 11/19/2015    Outpatient Encounter Prescriptions as of 08/18/2017  Medication Sig  . levonorgestrel (MIRENA) 20 MCG/24HR IUD 1 each by Intrauterine route once.  . [DISCONTINUED] norethindrone (AYGESTIN) 5 MG tablet Take 1 tablet (5 mg total) by mouth daily.   No facility-administered encounter medications on file as of 08/18/2017.     Allergies  Allergen Reactions  . Amitriptyline Anaphylaxis  . Amoxicillin Anaphylaxis  . Penicillins Hives, Shortness Of Breath and Swelling    ANY MEDS WITHIN THE "CILLIN"  FAMILY   . Dilantin  [Phenytoin Sodium Extended] Itching  . Vicodin  [Hydrocodone-Acetaminophen] Nausea Only and Nausea And Vomiting       Physical Exam  Constitutional: She is oriented to person, place, and time. She appears well-developed and well-nourished. No distress.  HENT:  Right Ear: External ear normal.  Left Ear: External ear normal.  Nose: Right sinus exhibits frontal sinus tenderness. Right sinus exhibits no maxillary sinus tenderness. Left sinus exhibits frontal sinus tenderness. Left sinus exhibits no maxillary sinus tenderness.  Mouth/Throat: Oropharynx is clear and moist. No oropharyngeal exudate, posterior oropharyngeal edema or posterior oropharyngeal erythema.  Tms opaque bilaterally   Eyes: Conjunctivae are normal. Right eye exhibits no discharge. Left eye exhibits no discharge.  Neck: Neck supple.  Cardiovascular: Normal rate and regular rhythm.   Pulmonary/Chest: Effort normal and breath sounds normal.  Lymphadenopathy:    She has cervical adenopathy.  Neurological: She is alert and oriented to person, place, and time.  Skin: Skin is warm and dry.  She is not diaphoretic.  Psychiatric: She has a normal mood and affect. Her behavior is normal.       Assessment & Plan:  1. Acute non-recurrent frontal sinusitis  - doxycycline (VIBRA-TABS) 100 MG tablet; Take 1 tablet (100 mg total) by mouth  2 (two) times daily.  Dispense: 20 tablet; Refill: 0 - benzonatate (TESSALON PERLES) 100 MG capsule; Take 1 capsule (100 mg total) by mouth 3 (three) times daily.  Dispense: 21 capsule; Refill: 0  The entirety of the information documented in the History of Present Illness, Review of Systems and Physical Exam were personally obtained by me. Portions of this information were initially documented by Kavin LeechLaura Walsh, CMA and reviewed by me for thoroughness and accuracy.   No Follow-up on file.

## 2017-08-18 NOTE — Patient Instructions (Signed)

## 2017-09-28 ENCOUNTER — Ambulatory Visit: Payer: Managed Care, Other (non HMO) | Admitting: Obstetrics and Gynecology

## 2017-10-22 ENCOUNTER — Ambulatory Visit: Payer: 59 | Admitting: Physician Assistant

## 2017-10-22 ENCOUNTER — Encounter: Payer: Self-pay | Admitting: Physician Assistant

## 2017-10-22 VITALS — BP 116/72 | HR 84 | Temp 98.4°F | Resp 16 | Wt 209.0 lb

## 2017-10-22 DIAGNOSIS — Z720 Tobacco use: Secondary | ICD-10-CM | POA: Diagnosis not present

## 2017-10-22 DIAGNOSIS — R55 Syncope and collapse: Secondary | ICD-10-CM

## 2017-10-22 DIAGNOSIS — J0100 Acute maxillary sinusitis, unspecified: Secondary | ICD-10-CM | POA: Diagnosis not present

## 2017-10-22 MED ORDER — DOXYCYCLINE HYCLATE 100 MG PO TABS: 100.0000 mg | ORAL_TABLET | Freq: Two times a day (BID) | ORAL | 0 refills | Status: AC

## 2017-10-22 NOTE — Patient Instructions (Signed)
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
## 2018-02-24 ENCOUNTER — Ambulatory Visit (INDEPENDENT_AMBULATORY_CARE_PROVIDER_SITE_OTHER): Payer: Medicaid Other | Admitting: Obstetrics and Gynecology

## 2018-02-24 ENCOUNTER — Encounter: Payer: Self-pay | Admitting: Obstetrics and Gynecology

## 2018-02-24 VITALS — BP 124/78 | Ht 65.0 in | Wt 200.0 lb

## 2018-02-24 DIAGNOSIS — N806 Endometriosis in cutaneous scar: Secondary | ICD-10-CM | POA: Diagnosis not present

## 2018-02-24 DIAGNOSIS — Z01419 Encounter for gynecological examination (general) (routine) without abnormal findings: Secondary | ICD-10-CM

## 2018-02-24 DIAGNOSIS — Z1331 Encounter for screening for depression: Secondary | ICD-10-CM | POA: Diagnosis not present

## 2018-02-24 DIAGNOSIS — Z3009 Encounter for other general counseling and advice on contraception: Secondary | ICD-10-CM | POA: Diagnosis not present

## 2018-02-24 DIAGNOSIS — Z1339 Encounter for screening examination for other mental health and behavioral disorders: Secondary | ICD-10-CM

## 2018-02-24 DIAGNOSIS — N946 Dysmenorrhea, unspecified: Secondary | ICD-10-CM

## 2018-02-24 DIAGNOSIS — N920 Excessive and frequent menstruation with regular cycle: Secondary | ICD-10-CM | POA: Insufficient documentation

## 2018-02-24 MED ORDER — VENLAFAXINE HCL ER 75 MG PO CP24: 75.0000 mg | ORAL_CAPSULE | Freq: Every day | ORAL | 3 refills | Status: DC

## 2018-02-24 NOTE — Progress Notes (Signed)
Gynecology Annual Exam  PCP: Trey Sailors, PA-C  Chief Complaint  Patient presents with  . Annual Exam    needs IUD replaced, will schedule after Annual d/t ins not paying for all of it in one day   History of Present Illness:  Ms. Kaitlin Phillips is a 32 y.o. [id number] who LMP was No LMP recorded. Patient is not currently having periods (Reason: IUD)., presents today for her annual examination.  With her Mirena IUD she still has bleeding about 1 week each month with symptoms of monthly, heavy bleeding, severe headache, feeling dizzy. She gets to the point where she has to sit down from feeling poorly, at times.  She does not believe the IUD has helped and things may have even gotten worse overall.  However, she is concerned that things would be worse, if the IUD were removed. The IUD was placed 4 years ago.    She is single partner, contraception - tubal ligation and IUD.  Last Pap: 1 year  Results were: no abnormalities /neg HPV DNA negative Hx of STDs: none  Last mammogram: not due I clarified her family history. She states that there is no definitive breast or ovarian cancer in the family.   Tobacco use: daily, 1 ppd Alcohol use: social drinker Exercise: very active  The patient wears seatbelts: yes.      Endometriosis:  Still has very bad pain at times, especially left side. Hoewever, sometimes the pain radiates across to the side.  She has some days that are just about debilitating.  She started norethindrone last year, but had severe nausea and had to stop the medication shortly after starting. She has taken gabapentin in the past for seizures.   She does not want to take that medication for that due to her history of seizures.    She does report getting frustrated, with crying and getting upset. This is mainly due to her 33 year-old who has autism.  Managing him has been tough.  She states that overall she is coping well.   Past Medical History:  Diagnosis Date  . Anemia      h/o years ago  . 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 GERD (gastroesophageal reflux disease)    takes apple cider vinegar daily and has no problems with GERD  . Migraine headache   . Seizure (HCC)    last seizure in 2008    Past Surgical History:  Procedure Laterality Date  . CESAREAN SECTION  09/08/2013   [facility]- x2  . EXCISION OF ABDOMINAL WALL TUMOR N/A 01/06/2017   ENDOMETRIOSIS./ EXCISION OF ABDOMINAL WALL MASS;  Surgeon: Kieth Brightly, MD;  Location: [facility] ORS;  Service: General;  Laterality: N/A;  . FACIAL FRACTURE SURGERY  2000  . LAPAROSCOPIC ENDOMETRIOSIS FULGURATION    . TONSILLECTOMY     and tubes in ears  . TUBAL LIGATION  09/2013   [facility]  . WISDOM TOOTH EXTRACTION      Prior to Admission medications   Medication Sig Start Date End Date Taking? Authorizing Provider  levonorgestrel (MIRENA) 20 MCG/24HR IUD 1 each by Intrauterine route once.    [provider]    Allergies  Allergen Reactions  . Amitriptyline Anaphylaxis  . Amoxicillin Anaphylaxis  . Penicillins Hives, Shortness Of Breath and Swelling    ANY MEDS WITHIN THE "CILLIN"  FAMILY   . Dilantin  [Phenytoin Sodium Extended] Itching  . Vicodin  [Hydrocodone-Acetaminophen] Nausea Only and Nausea And Vomiting  Gynecologic History: No LMP recorded. Patient is not currently having periods (Reason: IUD).  Obstetric History: [id number]  Social History   Socioeconomic History  . Marital status: Married    Spouse name: Not on file  . Number of children: Not on file  . Years of education: Not on file  . Highest education level: Not on file  Social Needs  . Financial resource strain: Not on file  . Food insecurity - worry: Not on file  . Food insecurity - inability: Not on file  . Transportation needs - medical: Not on file  . Transportation needs - non-medical: Not on file  Occupational History  . Not on file  Tobacco Use  . Smoking status: Current Every Day Smoker    Packs/day: 1.00     Years: 7.00    Pack years: 7.00    Types: Cigarettes  . Smokeless tobacco: Never Used  Substance and Sexual Activity  . Alcohol use: Yes    Comment: rare  . Drug use: No  . Sexual activity: Yes    Birth control/protection: IUD  Other Topics Concern  . Not on file  Social History Narrative  . Not on file    Family History  Problem Relation Age of Onset  . Hypertension Mother   . Breast cancer Mother 62  . Ovarian cancer Mother 79  . Depression Sister   . Lung cancer Father        deceased  . Ovarian cancer Maternal Grandmother     Review of Systems  Constitutional: Positive for malaise/fatigue and weight loss (intentional). Negative for chills and fever.  HENT: Negative.   Eyes: Negative.   Respiratory: Negative.   Cardiovascular: Negative.   Gastrointestinal: Positive for abdominal pain (see hpi) and nausea. Negative for blood in stool, constipation, diarrhea, heartburn and vomiting.  Genitourinary: Negative.   Musculoskeletal: Negative.   Skin: Negative.   Neurological: Positive for dizziness and headaches.  Endo/Heme/Allergies: Negative.   Psychiatric/Behavioral: Negative.      Physical Exam BP 124/78   Ht 5\' 5"  (1.651 m)   Wt 200 lb (90.7 kg)   BMI 33.28 kg/m    Physical Exam  Constitutional: She is oriented to person, place, and time. She appears well-developed and well-nourished. No distress.  Genitourinary: Uterus normal. Pelvic exam was performed with patient supine. There is no rash, tenderness, lesion or injury on the right labia. There is no rash, tenderness, lesion or injury on the left labia. No erythema, tenderness or bleeding in the vagina. No signs of injury around the vagina. No vaginal discharge found. Right adnexum does not display mass, does not display tenderness and does not display fullness. Left adnexum does not display mass, does not display tenderness and does not display fullness.  Cervix exhibits visible IUD strings. Cervix does not  exhibit motion tenderness, lesion, discharge or polyp.   Uterus is mobile and anteverted. Uterus is not enlarged, tender or exhibiting a mass.  HENT:  Head: Normocephalic and atraumatic.  Eyes: EOM are normal. No scleral icterus.  Neck: Normal range of motion. Neck supple. No thyromegaly present.  Cardiovascular: Normal rate and regular rhythm. Exam reveals no gallop and no friction rub.  No murmur heard. Pulmonary/Chest: Effort normal and breath sounds normal. No respiratory distress. She has no wheezes. She has no rales. Right breast exhibits no inverted nipple, no mass, no nipple discharge, no skin change and no tenderness. Left breast exhibits mass. Left breast exhibits no inverted nipple, no nipple discharge, no  skin change and no tenderness.    Abdominal: Soft. Bowel sounds are normal. She exhibits no distension and no mass. There is no tenderness. There is no rebound and no guarding.  Along and just above incisional scar there is some deep induration that is quite tender to touch.  Width (cranio-caudal) of this induration is about 1-2 cm.    Musculoskeletal: Normal range of motion. She exhibits no edema or tenderness.  Lymphadenopathy:    She has no cervical adenopathy.       Right: No inguinal adenopathy present.       Left: No inguinal adenopathy present.  Neurological: She is alert and oriented to person, place, and time. No cranial nerve deficit.  Skin: Skin is warm and dry. No rash noted. No erythema.  Psychiatric: She has a normal mood and affect. Her behavior is normal. Judgment normal.    Female chaperone present for pelvic and breast  portions of the physical exam  Results: AUDIT Questionnaire (screen for alcoholism): 1 PHQ-9: 4   Assessment: 32 y.o. [id number] female here for routine annual gynecologic examination.  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 -- Depression screening negative (PHQ-9) -- Nutrition: normal -- cholesterol screening: not due for screening -- osteoporosis screening: not due -- tobacco screening: using: discussed quitting using the 5 A's -- alcohol screening: AUDIT questionnaire indicates low-risk usage. -- family history of breast cancer screening: done. not at high risk. -- no evidence of domestic violence or intimate partner violence. -- STD screening: gonorrhea/chlamydia NAAT not collected per patient request. -- pap smear not collected per ASCCP guidelines -- HPV vaccination series: not eligilbe  Cutaneous endometriosis:  Long discussion about abnormal bleeding and pain.  Cutaneous endometriosis.  Discussed seeing general surgeon regarding eval of incision for surgery.  Given worsening symptoms and new findings suggesting recurrent disease, I strongly recommend she see a Development worker, international aid. She would like to try medical therapy.  Discussed using Lupron, Orilissa, SNRIs, gabapentin.  We also discussed management with hysterectomy.  Reiterated, like before, that retaining ovaries would not suppress estrogen at any endometriosis site.  She does have abnormal bleeding and hysterectomy would help with that.  However, she is managing okay for now, according to her.  She would like to hold off on a surgical referral at this time, though I strongly suggested one.  For now, will start Effexor and have her follow-up in 3 months to assess  response.  Subclavicular lesion: Differential includes enlarged lymph node and lipoma.  We will continue to monitor as she has no other breast symptoms or findings at this time.  She states that this particular area comes and goes and has not continued to grow in size over time.  Because of this, suspect this is a lymph node instead of lipoma.  25  minutes spent in face to face discussion with > 50% spent in counseling,management, and coordination of care of her endometriosis and her menorrhagia with regular period and dysmenorrhea.   Return in about 3 months (around 05/27/2018) for Follow up abomdinal medication with Dr. Jean RosenthalJackson.   Thomasene MohairStephen Jaden Batchelder, MD  02/24/2018 1:24 PM

## 2018-05-18 ENCOUNTER — Ambulatory Visit (INDEPENDENT_AMBULATORY_CARE_PROVIDER_SITE_OTHER): Payer: Self-pay | Admitting: Physician Assistant

## 2018-05-18 ENCOUNTER — Encounter: Payer: Self-pay | Admitting: Physician Assistant

## 2018-05-18 VITALS — BP 124/82 | HR 76 | Temp 98.5°F | Resp 16 | Wt 180.0 lb

## 2018-05-18 DIAGNOSIS — T63464A Toxic effect of venom of wasps, undetermined, initial encounter: Secondary | ICD-10-CM

## 2018-05-18 MED ORDER — METHYLPREDNISOLONE ACETATE 40 MG/ML IJ SUSP: 80.0000 mg | Freq: Once | INTRAMUSCULAR | Status: DC

## 2018-05-18 MED ORDER — PREDNISONE 10 MG (21) PO TBPK: ORAL_TABLET | ORAL | 0 refills | Status: DC

## 2018-05-18 NOTE — Progress Notes (Signed)
Patient: Kaitlin Phillips Female    DOB: 04/02/1986   32 y.o.   MRN: [id number] Visit Date: 05/19/2018  Today's Provider: Trey SailorsAdriana M Pollak, PA-C   Chief Complaint  Patient presents with  . Insect Bite    Right arm; happened last night.    Subjective:    HPI  Kaitlin CrimesHolly S Hert is a 32 y/o woman presenting today for wasp sting last night. Reports being stung by a wasp at 8:30 PM last night. Hand has been swelling since then. Has had some spreading redness and pain. A bit of numbness in her hands, tingling She reports one episode of vomiting this morning. Does not feel any throat swelling, SOB. She has an epipen from prior reaction to PCN. Has taken some Benadryl without relief.      Allergies  Allergen Reactions  . Amitriptyline Anaphylaxis  . Amoxicillin Anaphylaxis  . Penicillins Hives, Shortness Of Breath and Swelling    ANY MEDS WITHIN THE "CILLIN"  FAMILY   . Dilantin  [Phenytoin Sodium Extended] Itching  . Vicodin  [Hydrocodone-Acetaminophen] Nausea Only and Nausea And Vomiting     Current Outpatient Medications:  .  levonorgestrel (MIRENA) 20 MCG/24HR IUD, 1 each by Intrauterine route once., Disp: , Rfl:  .  venlafaxine XR (EFFEXOR-XR) 75 MG 24 hr capsule, Take 1 capsule (75 mg total) by mouth daily., Disp: 30 capsule, Rfl: 3 .  predniSONE (STERAPRED UNI-PAK 21 TAB) 10 MG (21) TBPK tablet, Take 6 pills on day 1, 5 pills on day 2, and so on until complete. Start on 05/19/2018., Disp: 21 tablet, Rfl: 0  Current Facility-Administered Medications:  .  methylPREDNISolone acetate (DEPO-MEDROL) injection 80 mg, 80 mg, Intramuscular, Once, Pollak, Adriana M, PA-C  Review of Systems  Constitutional: Positive for chills. Negative for activity change, appetite change, diaphoresis, fatigue, fever and unexpected weight change.  Gastrointestinal: Positive for nausea and vomiting. Negative for abdominal distention, abdominal pain, anal bleeding, blood in stool, constipation, diarrhea  and rectal pain.  Musculoskeletal: Positive for joint swelling. Negative for arthralgias, back pain, gait problem, myalgias, neck pain and neck stiffness.  Skin: Positive for color change and wound. Negative for pallor and rash.  Neurological: Positive for dizziness and light-headedness. Negative for headaches.    Social History   Tobacco Use  . Smoking status: Current Every Day Smoker    Packs/day: 1.00    Years: 7.00    Pack years: 7.00    Types: Cigarettes  . Smokeless tobacco: Never Used  Substance Use Topics  . Alcohol use: Yes    Comment: rare   Objective:   BP 124/82 (BP Location: Left Arm, Patient Position: Sitting, Cuff Size: Large)   Pulse 76   Temp 98.5 F (36.9 C) (Oral)   Resp 16   Wt 180 lb (81.6 kg)   BMI 29.95 kg/m  Vitals:   05/18/18 1204  BP: 124/82  Pulse: 76  Resp: 16  Temp: 98.5 F (36.9 C)  TempSrc: Oral  Weight: 180 lb (81.6 kg)     Physical Exam  Constitutional: She is oriented to person, place, and time. She appears well-developed and well-nourished.  Cardiovascular: Normal rate and regular rhythm.  Pulmonary/Chest: Effort normal and breath sounds normal.  Musculoskeletal: She exhibits edema.       Right hand: She exhibits decreased range of motion. She exhibits normal capillary refill. Decreased sensation noted. Decreased sensation is present in the medial distribution. Normal strength noted.  Strength and cap  refill preserved. Patient reports some numbness in first four fingers.   Neurological: She is alert and oriented to person, place, and time.  Skin: Skin is warm and dry. There is erythema.  There is significant edema of dorsum of right hand extending somewhat into wrist. There is overlying erythema and warmth.   Psychiatric: She has a normal mood and affect. Her behavior is normal.     Right Hand, 05/18/2018  Right hand, 05/18/2018    Assessment & Plan:     1. Wasp sting, undetermined intent, initial encounter  Think this  represents large localized reaction rather than cellulitis. She does have an epipen. Treat with Depo-medrol shot today in office, start prednisone taper tomorrow. Call back with issues. 50 mg benadryl Q6H.   - methylPREDNISolone acetate (DEPO-MEDROL) injection 80 mg - predniSONE (STERAPRED UNI-PAK 21 TAB) 10 MG (21) TBPK tablet; Take 6 pills on day 1, 5 pills on day 2, and so on until complete. Start on 05/19/2018.  Dispense: 21 tablet; Refill: 0  Return if symptoms worsen or fail to improve.  The entirety of the information documented in the History of Present Illness, Review of Systems and Physical Exam were personally obtained by me. Portions of this information were initially documented by Kavin Leech, CMA and reviewed by me for thoroughness and accuracy.        Trey Sailors, PA-C  [facility]

## 2018-05-19 NOTE — Patient Instructions (Signed)
Bee, Wasp, or [employer], Adult Bees, wasps, and hornets are part of a family of insects that can sting people. These stings can cause pain and inflammation, but they are usually not serious. However, some people may have an allergic reaction to a sting. This can cause the symptoms to be more severe. What increases the risk? You may be at a greater risk of getting stung if you:  Provoke a stinging insect by swatting or disturbing it.  Wear strong-smelling soaps, deodorants, or body sprays.  Spend time outdoors near gardens with flowers or fruit trees or in clothes that expose skin.  Eat or drink outside.  What are the signs or symptoms? Common symptoms of this condition include:  A red lump in the skin that sometimes has a tiny hole in the center. In some cases, a stinger may be in the center of the wound.  Pain and itching at the sting site.  Redness and swelling around the sting site. If you have an allergic reaction (localized allergic reaction), the swelling and redness may spread out from the sting site. In some cases, this reaction can continue to develop over the next 24-48 hours.  In rare cases, a person may have a severe allergic reaction (anaphylactic reaction) to a sting. Symptoms of an anaphylactic reaction may include:  Wheezing or difficulty breathing.  Raised, itchy, red patches on the skin (hives).  Nausea or vomiting.  Abdominal cramping.  Diarrhea.  Tightness in the chest or chest pain.  Dizziness or fainting.  Redness of the face (flushing).  Hoarse voice.  Swollen tongue, lips, or face.  How is this diagnosed? This condition is usually diagnosed based on your symptoms and medical history as well as a physical exam. You may have an allergy test to determine if you are allergic to the substance that the insect injected during the sting (venom). How is this treated? If you were stung by a bee, the stinger and a small sac of venom may be in the wound.  It is important to remove the stinger as soon as possible. You can do this by brushing across the wound with gauze, a fingernail, or a flat card such as a credit card. Removing the stinger can help reduce the severity of your body's reaction to the sting. Most stings can be treated with:  Icing to reduce swelling in the area.  Medicines (antihistamines) to treat itching or an allergic reaction.  Medicines to help reduce pain. These may be medicines that you take by mouth, or medicated creams or lotions that you apply to your skin.  Pay close attention to your symptoms after you have been stung. If possible, have someone stay with you to make sure you do not have an allergic reaction. If you have any signs of an allergic reaction, call your health care provider. If you have ever had a severe allergic reaction, your health care provider may give you an inhaler or injectable medicine (epinephrine auto-injector) to use if necessary. Follow these instructions at home:  Wash the sting site 2-3 times each day with soap and water as told by your health care provider.  Apply or take over-the-counter and prescription medicines only as told by your health care provider.  If directed, apply ice to the sting area. ? Put ice in a plastic bag. ? Place a towel between your skin and the bag. ? Leave the ice on for 20 minutes, 2-3 times a day.  Do not scratch the sting  area.  If you had a severe allergic reaction to a sting, you may need: ? To wear a medical bracelet or necklace that lists the allergy. ? To learn when and how to use an anaphylaxis kit or epinephrine injection. Your family members and coworkers may also need to learn this. ? To carry an anaphylaxis kit or epinephrine injection with you at all times. How is this prevented?  Avoid swatting at stinging insects and disturbing insect nests.  Do not use fragrant soaps or lotions.  Wear shoes, pants, and long sleeves when spending time  outdoors, especially in grassy areas where stinging insects are common.  Keep outdoor areas free from nests or hives.  Keep food and drink containers covered when eating outdoors.  Avoid working or sitting near flowering plants, if possible.  Wear gloves if you are gardening or working outdoors.  If an attack by a stinging insect or a swarm seems likely in the moment, move away from the area or find a barrier between you and the insect(s), such as a door. Contact a health care provider if:  Your symptoms do not get better in 2-3 days.  You have redness, swelling, or pain that spreads beyond the area of the sting.  You have a fever. Get help right away if: You have symptoms of a severe allergic reaction. These include:  Wheezing or difficulty breathing.  Tightness in the chest or chest pain.  Light-headedness or fainting.  Itchy, raised, red patches on the skin.  Nausea or vomiting.  Abdominal cramping.  Diarrhea.  A swollen tongue or lips, or trouble swallowing.  Dizziness or fainting.  Summary  Stings from bees, wasps, and hornets can cause pain and inflammation, but they are usually not serious. However, some people may have an allergic reaction to a sting. This can cause the symptoms to be more severe.  Pay close attention to your symptoms after you have been stung. If possible, have someone stay with you to make sure you do not have an allergic reaction.  Call your health care provider if you have any signs of an allergic reaction. This information is not intended to replace advice given to you by your health care provider. Make sure you discuss any questions you have with your health care provider. Document Released: 11/24/2005 Document Revised: 01/29/2017 Document Reviewed: 01/29/2017 Elsevier Interactive Patient Education  2018 [employer]  

## 2018-08-10 ENCOUNTER — Encounter: Payer: Self-pay | Admitting: Obstetrics and Gynecology

## 2018-08-31 ENCOUNTER — Emergency Department
Admission: EM | Admit: 2018-08-31 | Discharge: 2018-08-31 | Disposition: A | Discharge status: Home / Self Care | Payer: Managed Care, Other (non HMO) | Attending: Emergency Medicine | Admitting: Emergency Medicine

## 2018-08-31 ENCOUNTER — Telehealth: Payer: Self-pay

## 2018-08-31 ENCOUNTER — Other Ambulatory Visit: Payer: Self-pay

## 2018-08-31 DIAGNOSIS — F17210 Nicotine dependence, cigarettes, uncomplicated: Secondary | ICD-10-CM | POA: Insufficient documentation

## 2018-08-31 DIAGNOSIS — R109 Unspecified abdominal pain: Secondary | ICD-10-CM

## 2018-08-31 DIAGNOSIS — R1012 Left upper quadrant pain: Secondary | ICD-10-CM | POA: Insufficient documentation

## 2018-08-31 DIAGNOSIS — Z79899 Other long term (current) drug therapy: Secondary | ICD-10-CM | POA: Insufficient documentation

## 2018-08-31 LAB — COMPREHENSIVE METABOLIC PANEL
ALBUMIN: 4.6 g/dL (ref 3.5–5.0)
ALK PHOS: 46 U/L (ref 38–126)
ALT: 15 U/L (ref 0–44)
ANION GAP: 7 (ref 5–15)
AST: 18 U/L (ref 15–41)
BILIRUBIN TOTAL: 0.6 mg/dL (ref 0.3–1.2)
BUN: 11 mg/dL (ref 6–20)
CALCIUM: 9.4 mg/dL (ref 8.9–10.3)
CO2: 26 mmol/L (ref 22–32)
CREATININE: 0.86 mg/dL (ref 0.44–1.00)
Chloride: 105 mmol/L (ref 98–111)
GFR calc Af Amer: >60 mL/min (ref >60)
GFR calc non Af Amer: >60 mL/min (ref >60)
GLUCOSE: 99 mg/dL (ref 70–99)
Potassium: 3.8 mmol/L (ref 3.5–5.1)
Sodium: 138 mmol/L (ref 135–145)
Total Protein: 7.5 g/dL (ref 6.5–8.1)

## 2018-08-31 LAB — URINALYSIS, COMPLETE (UACMP) WITH MICROSCOPIC
Bacteria, UA: NONE SEEN
Bilirubin Urine: NEGATIVE
GLUCOSE, UA: NEGATIVE mg/dL
HGB URINE DIPSTICK: NEGATIVE
Ketones, ur: 5 mg/dL — AB
Leukocytes, UA: NEGATIVE
NITRITE: NEGATIVE
PH: 6 (ref 5.0–8.0)
PROTEIN: NEGATIVE mg/dL
SPECIFIC GRAVITY, URINE: 1.024 (ref 1.005–1.030)

## 2018-08-31 LAB — POCT PREGNANCY, URINE: Preg Test, Ur: NEGATIVE

## 2018-08-31 LAB — CBC
HCT: 44.7 % (ref 35.0–47.0)
HEMOGLOBIN: 15.8 g/dL (ref 12.0–16.0)
MCH: 33.7 pg (ref 26.0–34.0)
MCHC: 35.4 g/dL (ref 32.0–36.0)
MCV: 95.4 fL (ref 80.0–100.0)
PLATELETS: 154 10*3/uL (ref 150–440)
RBC: 4.68 MIL/uL (ref 3.80–5.20)
RDW: 12.8 % (ref 11.5–14.5)
WBC: 6.1 10*3/uL (ref 3.6–11.0)

## 2018-08-31 LAB — LIPASE, BLOOD: Lipase: 37 U/L (ref 11–51)

## 2018-08-31 MED ORDER — SUCRALFATE 1 G PO TABS: 1.0000 g | ORAL_TABLET | Freq: Four times a day (QID) | ORAL | 0 refills | Status: DC

## 2018-08-31 MED ORDER — LIDOCAINE VISCOUS HCL 2 % MT SOLN
15.0000 mL | Freq: Once | OROMUCOSAL | Status: AC
  Administered 2018-08-31: 15 mL via OROMUCOSAL
  Filled 2018-08-31: qty 15

## 2018-08-31 MED ORDER — PANTOPRAZOLE SODIUM 20 MG PO TBEC: 20.0000 mg | DELAYED_RELEASE_TABLET | Freq: Every day | ORAL | 1 refills | Status: DC

## 2018-08-31 MED ORDER — LIDOCAINE VISCOUS HCL 2 % MT SOLN: 15.0000 mL | Freq: Four times a day (QID) | OROMUCOSAL | 0 refills | Status: DC | PRN

## 2018-08-31 NOTE — ED Triage Notes (Signed)
Pt c/o LUQ pain for over a week with diarrhea. Pt states she has seen her PCP and given antiacids and taking OTC mylanta. States she lost 135lbs  Over 1.5 years by drinking 1 cup of water mixed with one tablespoon of baking soda 2 teaspoons of lemon juice 3 times daily with her regular diet recommended by her PCP. States she is not sure if that may have harmed her stomach.

## 2018-08-31 NOTE — ED Notes (Signed)
Pt ambulatory to treatment room and in NAD. Pt is hunched over slightly while walking and appears to be uncomfortable standing.

## 2018-08-31 NOTE — Discharge Instructions (Signed)
Please seek medical attention for any high fevers, chest pain, shortness of breath, change in behavior, persistent vomiting, bloody stool or any other new or concerning symptoms.  

## 2018-08-31 NOTE — ED Provider Notes (Signed)
[facility] Emergency Department Provider Note  ____________________________________________   I have reviewed the triage vital signs and the nursing notes.   HISTORY  Chief Complaint Abdominal Pain   History limited by: Not Limited   HPI Kaitlin Phillips is a 32 y.o. female who presents to the emergency department today because of concerns for abdominal pain.  It started roughly 1-1/2 weeks ago.  It is located in the epigastric and left upper quadrant.  The patient went to her medical provider last week who started her on antiacid another medication to help coat the stomach.  She denies any significant relief.  She states the pain has been constant.  Reminds her when she has had endometria-itis in the past however this would be a new location.  The patient denies any change in stooling.  She has noticed a slightly bad odor to her urine.  She denies any fevers.   Per medical record review patient has a history of endometriosis  Past Medical History:  Diagnosis Date  . Anemia    h/o years ago  . 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 GERD (gastroesophageal reflux disease)    takes apple cider vinegar daily and has no problems with GERD  . Migraine headache   . Seizure [facility])    last seizure in 2008    Patient Active Problem List   Diagnosis Date Noted  . Menorrhagia with regular cycle 02/24/2018  . Dysmenorrhea 02/24/2018  . Family history of ovarian cancer 02/09/2017  . Family history of breast cancer 02/09/2017  . 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 History of migraine headaches 11/19/2015  . History of seizures 11/19/2015    Past Surgical History:  Procedure Laterality Date  . CESAREAN SECTION  09/08/2013   [facility]- x2  . EXCISION OF ABDOMINAL WALL TUMOR N/A 01/06/2017   ENDOMETRIOSIS./ EXCISION OF ABDOMINAL WALL MASS;  Surgeon: Kieth Brightly, MD;  Location: [facility] ORS;  Service: General;  Laterality: N/A;  . FACIAL FRACTURE SURGERY  2000  .  LAPAROSCOPIC ENDOMETRIOSIS FULGURATION    . TONSILLECTOMY     and tubes in ears  . TUBAL LIGATION  09/2013   [facility]  . WISDOM TOOTH EXTRACTION      Prior to Admission medications   Medication Sig Start Date End Date Taking? Authorizing Provider  levonorgestrel (MIRENA) 20 MCG/24HR IUD 1 each by Intrauterine route once.    [provider]  predniSONE (STERAPRED UNI-PAK 21 TAB) 10 MG (21) TBPK tablet Take 6 pills on day 1, 5 pills on day 2, and so on until complete. Start on 05/19/2018. 05/18/18   Trey Sailors, PA-C  venlafaxine XR (EFFEXOR-XR) 75 MG 24 hr capsule Take 1 capsule (75 mg total) by mouth daily. 02/24/18   Conard Novak, MD    Allergies Amitriptyline; Amoxicillin; Penicillins; Dilantin  [phenytoin sodium extended]; and Vicodin  [hydrocodone-acetaminophen]  Family History  Problem Relation Age of Onset  . Hypertension Mother   . Breast cancer Mother 70       not definitive hx  . Depression Sister   . Lung cancer Father        deceased    Social History Social History   Tobacco Use  . Smoking status: Current Every Day Smoker    Packs/day: 1.00    Years: 7.00    Pack years: 7.00    Types: Cigarettes  . Smokeless tobacco: Never Used  Substance Use Topics  . Alcohol use: Yes  Comment: rare  . Drug use: No    Review of Systems Constitutional: No fever/chills Eyes: No visual changes. ENT: No sore throat. Cardiovascular: Denies chest pain. Respiratory: Denies shortness of breath. Gastrointestinal: Positive for abdominal pain. Genitourinary: Negative for dysuria. Musculoskeletal: Negative for back pain. Skin: Negative for rash. Neurological: Negative for headaches, focal weakness or numbness.  ____________________________________________   PHYSICAL EXAM:  VITAL SIGNS: ED Triage Vitals  Enc Vitals Group     BP 08/31/18 1349 130/85     Pulse Rate 08/31/18 1349 94     Resp 08/31/18 1349 18     Temp 08/31/18 1349 98.4 F (36.9 C)      Temp Source 08/31/18 1349 Oral     SpO2 08/31/18 1349 98 %     Weight 08/31/18 1349 165 lb (74.8 kg)     Height 08/31/18 1349 5\' 5"  (1.651 m)     Head Circumference --      Peak Flow --      Pain Score 08/31/18 1356 8     Pain Loc --      Pain Edu? --      Excl. in GC? --      Constitutional: Alert and oriented.  Eyes: Conjunctivae are normal.  ENT      Head: Normocephalic and atraumatic.      Nose: No congestion/rhinnorhea.      Mouth/Throat: Mucous membranes are moist.      Neck: No stridor. Hematological/Lymphatic/Immunilogical: No cervical lymphadenopathy. Cardiovascular: Normal rate, regular rhythm.  No murmurs, rubs, or gallops.  Respiratory: Normal respiratory effort without tachypnea nor retractions. Breath sounds are clear and equal bilaterally. No wheezes/rales/rhonchi. Gastrointestinal: Soft and tender in the LUQ. No rebound. No guarding.  Genitourinary: Deferred Musculoskeletal: Normal range of motion in all extremities. No lower extremity edema. Neurologic:  Normal speech and language. No gross focal neurologic deficits are appreciated.  Skin:  Skin is warm, dry and intact. No rash noted. Psychiatric: Mood and affect are normal. Speech and behavior are normal. Patient exhibits appropriate insight and judgment.  ____________________________________________    LABS (pertinent positives/negatives)  Upreg negative Lipase 37 CBC wbc 6.1, hgb 15.8, plt 154 CMP wnl UA clear, ketones 5 otherwise unremarkable.   ____________________________________________   EKG  None  ____________________________________________    RADIOLOGY  None  ____________________________________________   PROCEDURES  Procedures  ____________________________________________   INITIAL IMPRESSION / ASSESSMENT AND PLAN / ED COURSE  Pertinent labs & imaging results that were available during my care of the patient were reviewed by me and considered in my medical decision making  (see chart for details).   Presented to the emergency department today because of concerns for left upper quadrant pain.  On exam she is tender in the left upper quadrant.  Differential would include gastritis, gastric ulcer, pancreatitis, diverticulosis, zosters amongst other etiologies.  Patient's blood work without concerning findings.  No leukocytosis.  At this point I had a discussion with the patient about potentially obtaining a CT scan.  At this point given afebrile and without elevated white count do not feel CT scan is necessary.  Patient felt comfortable deferring.  Patient did feel better after this lidocaine.  At this point I think gastritis likely.  Discussed this with the patient.  Will discharge with medication, dietary changes GI follow-up.  ____________________________________________   FINAL CLINICAL IMPRESSION(S) / ED DIAGNOSES  Final diagnoses:  Abdominal pain, unspecified abdominal location     Note: This dictation was prepared with Dragon dictation.  Any transcriptional errors that result from this process are unintentional     Phineas SemenGoodman, Laryah Neuser, MD 08/31/18 [phone]

## 2018-08-31 NOTE — ED Notes (Signed)
AAOx3.  Skin warm and dry.  NAD 

## 2018-08-31 NOTE — Telephone Encounter (Signed)
Patient reports persistent left abdominal pain for over a week. Patient reports nausea, and diarrhea . Patient denies any fever, or blood in stool. Patient reports she has been taking OTC medications for acid reflux and reports no pain control. Patient advised to go to ER or urgent care, for evaluation and treatment. Patient agreed. sd

## 2019-02-11 ENCOUNTER — Telehealth: Payer: Self-pay | Admitting: Obstetrics and Gynecology

## 2019-02-11 NOTE — Telephone Encounter (Signed)
Patient is schedule 03/09/19 with [facility] for mirena removal and reinsertion

## 2019-03-09 ENCOUNTER — Encounter: Payer: Self-pay | Admitting: Obstetrics and Gynecology

## 2019-03-09 ENCOUNTER — Ambulatory Visit (INDEPENDENT_AMBULATORY_CARE_PROVIDER_SITE_OTHER): Payer: Medicaid Other | Admitting: Obstetrics and Gynecology

## 2019-03-09 ENCOUNTER — Other Ambulatory Visit: Payer: Self-pay

## 2019-03-09 VITALS — BP 124/78 | Ht 65.0 in | Wt 156.0 lb

## 2019-03-09 DIAGNOSIS — Z30433 Encounter for removal and reinsertion of intrauterine contraceptive device: Secondary | ICD-10-CM | POA: Diagnosis not present

## 2019-03-09 MED ORDER — LEVONORGESTREL 20 MCG/24HR IU IUD: 1.0000 | INTRAUTERINE_SYSTEM | Freq: Once | INTRAUTERINE | 0 refills | Status: DC

## 2019-03-09 NOTE — Patient Instructions (Signed)
Intrauterine Device Insertion, Care After    This sheet gives you information about how to care for yourself after your procedure. Your health care provider may also give you more specific instructions. If you have problems or questions, contact your health care provider.  What can I expect after the procedure?  After the procedure, it is common to have:  · Cramps and pain in the abdomen.  · Light bleeding (spotting) or heavier bleeding that is like your menstrual period. This may last for up to a few days.  · Lower back pain.  · Dizziness.  · Headaches.  · Nausea.  Follow these instructions at home:  · Before resuming sexual activity, check to make sure that you can feel the IUD string(s). You should be able to feel the end of the string(s) below the opening of your cervix. If your IUD string is in place, you may resume sexual activity.  ? If you had a hormonal IUD inserted more than 7 days after your most recent period started, you will need to use a backup method of birth control for 7 days after IUD insertion. Ask your health care provider whether this applies to you.  · Continue to check that the IUD is still in place by feeling for the string(s) after every menstrual period, or once a month.  · Take over-the-counter and prescription medicines only as told by your health care provider.  · Do not drive or use heavy machinery while taking prescription pain medicine.  · Keep all follow-up visits as told by your health care provider. This is important.  Contact a health care provider if:  · You have bleeding that is heavier or lasts longer than a normal menstrual cycle.  · You have a fever.  · You have cramps or abdominal pain that get worse or do not get better with medicine.  · You develop abdominal pain that is new or is not in the same area of earlier cramping and pain.  · You feel lightheaded or weak.  · You have abnormal or bad-smelling discharge from your vagina.  · You have pain during sexual  activity.  · You have any of the following problems with your IUD string(s):  ? The string bothers or hurts you or your sexual partner.  ? You cannot feel the string.  ? The string has gotten longer.  · You can feel the IUD in your vagina.  · You think you may be pregnant, or you miss your menstrual period.  · You think you may have an STI (sexually transmitted infection).  Get help right away if:  · You have flu-like symptoms.  · You have a fever and chills.  · You can feel that your IUD has slipped out of place.  Summary  · After the procedure, it is common to have cramps and pain in the abdomen. It is also common to have light bleeding (spotting) or heavier bleeding that is like your menstrual period.  · Continue to check that the IUD is still in place by feeling for the string(s) after every menstrual period, or once a month.  · Keep all follow-up visits as told by your health care provider. This is important.  · Contact your health care provider if you have problems with your IUD string(s), such as the string getting longer or bothering you or your sexual partner.  This information is not intended to replace advice given to you by your health care provider. Make   sure you discuss any questions you have with your health care provider.  Document Released: 07/23/2011 Document Revised: 10/15/2016 Document Reviewed: 10/15/2016  Elsevier Interactive Patient Education © 2019 [employer]

## 2019-03-09 NOTE — Progress Notes (Signed)
   IUD Removal  Patient identified, informed consent performed, consent signed.  Patient was in the dorsal lithotomy position, normal external genitalia was noted.  A speculum was placed in the patient's vagina, normal discharge was noted, no lesions. The cervix was visualized, no lesions, no abnormal discharge.  The strings of the IUD were grasped and pulled using ring forceps. The IUD was removed in its entirety. Patient tolerated the procedure well.    IUD Insertion Procedure Note Patient identified, informed consent performed, consent signed.   Discussed risks of irregular bleeding, cramping, infection, malpositioning, expulsion or uterine perforation of the IUD (1:1000 placements)  which may require further procedure such as laparoscopy.  IUD while effective at preventing pregnancy do not prevent transmission of sexually transmitted diseases and use of barrier methods for this purpose was discussed. Time out was performed.  Urine pregnancy test negative.  Speculum already placed in the vagina.  Cervix previously visualized.  Cleaned with Betadine x 2.  Grasped anteriorly with a single tooth tenaculum.  Uterus sounded to 9 cm. IUD placed per manufacturer's recommendations.  Strings trimmed to 3 cm. Tenaculum was removed, good hemostasis noted.  Patient tolerated procedure well.   Patient was given post-procedure instructions.  She was advised to have backup contraception for one week.  Patient was also asked to check IUD strings periodically and follow up in 4 weeks for IUD check.  Thomasene Mohair, MD, [facility], [facility] 03/09/2019 10:09 AM

## 2019-03-23 NOTE — Telephone Encounter (Signed)
Pt rcvd/charged Mirena & removal/insert 03/09/2019

## 2020-04-03 ENCOUNTER — Encounter: Payer: Self-pay | Admitting: *Deleted

## 2020-04-03 ENCOUNTER — Other Ambulatory Visit: Payer: Self-pay

## 2020-04-03 ENCOUNTER — Emergency Department: Payer: Self-pay

## 2020-04-03 DIAGNOSIS — Y999 Unspecified external cause status: Secondary | ICD-10-CM | POA: Insufficient documentation

## 2020-04-03 DIAGNOSIS — F17210 Nicotine dependence, cigarettes, uncomplicated: Secondary | ICD-10-CM | POA: Insufficient documentation

## 2020-04-03 DIAGNOSIS — M25511 Pain in right shoulder: Secondary | ICD-10-CM | POA: Insufficient documentation

## 2020-04-03 DIAGNOSIS — Y92488 Other paved roadways as the place of occurrence of the external cause: Secondary | ICD-10-CM | POA: Insufficient documentation

## 2020-04-03 DIAGNOSIS — R519 Headache, unspecified: Secondary | ICD-10-CM | POA: Insufficient documentation

## 2020-04-03 DIAGNOSIS — Y93I9 Activity, other involving external motion: Secondary | ICD-10-CM | POA: Insufficient documentation

## 2020-04-03 DIAGNOSIS — Z79899 Other long term (current) drug therapy: Secondary | ICD-10-CM | POA: Insufficient documentation

## 2020-04-03 DIAGNOSIS — S2231XA Fracture of one rib, right side, initial encounter for closed fracture: Secondary | ICD-10-CM | POA: Insufficient documentation

## 2020-04-03 NOTE — ED Triage Notes (Signed)
Pt states she fell over in a side by side tonight.  Pt has right shoulder pain.decreased rom .Hematoma to right side of head.  Abrasion to forehead.  No loc  No vomiting.  Pt alert  Speech clear.

## 2020-04-04 ENCOUNTER — Emergency Department
Admission: EM | Admit: 2020-04-04 | Discharge: 2020-04-04 | Disposition: A | Discharge status: Home / Self Care | Payer: Self-pay | Attending: Emergency Medicine | Admitting: Emergency Medicine

## 2020-04-04 ENCOUNTER — Emergency Department: Payer: Self-pay

## 2020-04-04 ENCOUNTER — Other Ambulatory Visit: Payer: Self-pay

## 2020-04-04 DIAGNOSIS — T1490XA Injury, unspecified, initial encounter: Secondary | ICD-10-CM

## 2020-04-04 DIAGNOSIS — S2231XA Fracture of one rib, right side, initial encounter for closed fracture: Secondary | ICD-10-CM

## 2020-04-04 LAB — CBC
HCT: 46.3 % — ABNORMAL HIGH (ref 36.0–46.0)
Hemoglobin: 16.5 g/dL — ABNORMAL HIGH (ref 12.0–15.0)
MCH: 34.7 pg — ABNORMAL HIGH (ref 26.0–34.0)
MCHC: 35.6 g/dL (ref 30.0–36.0)
MCV: 97.3 fL (ref 80.0–100.0)
Platelets: 175 10*3/uL (ref 150–400)
RBC: 4.76 MIL/uL (ref 3.87–5.11)
RDW: 11.7 % (ref 11.5–15.5)
WBC: 10.1 10*3/uL (ref 4.0–10.5)
nRBC: 0 % (ref 0.0–0.2)

## 2020-04-04 LAB — BASIC METABOLIC PANEL
Anion gap: 12 (ref 5–15)
BUN: 6 mg/dL (ref 6–20)
CO2: 24 mmol/L (ref 22–32)
Calcium: 9.4 mg/dL (ref 8.9–10.3)
Chloride: 104 mmol/L (ref 98–111)
Creatinine, Ser: 0.5 mg/dL (ref 0.44–1.00)
GFR calc Af Amer: >60 mL/min (ref >60)
GFR calc non Af Amer: >60 mL/min (ref >60)
Glucose, Bld: 110 mg/dL — ABNORMAL HIGH (ref 70–99)
Potassium: 3.3 mmol/L — ABNORMAL LOW (ref 3.5–5.1)
Sodium: 140 mmol/L (ref 135–145)

## 2020-04-04 LAB — TROPONIN I (HIGH SENSITIVITY): Troponin I (High Sensitivity): 2 ng/L (ref <18)

## 2020-04-04 MED ORDER — OXYCODONE HCL 5 MG PO TABS
5.0000 mg | ORAL_TABLET | Freq: Once | ORAL | Status: AC
  Administered 2020-04-04: 5 mg via ORAL
  Filled 2020-04-04: qty 1

## 2020-04-04 MED ORDER — OXYCODONE-ACETAMINOPHEN 5-325 MG PO TABS: 1.0000 | ORAL_TABLET | ORAL | 0 refills | Status: DC | PRN

## 2020-04-04 MED ORDER — OXYCODONE-ACETAMINOPHEN 5-325 MG PO TABS
1.0000 | ORAL_TABLET | Freq: Once | ORAL | Status: AC
  Administered 2020-04-04: 1 via ORAL
  Filled 2020-04-04: qty 1

## 2020-04-04 MED ORDER — IBUPROFEN 600 MG PO TABS
600.0000 mg | ORAL_TABLET | Freq: Once | ORAL | Status: AC
  Administered 2020-04-04: 600 mg via ORAL
  Filled 2020-04-04: qty 1

## 2020-04-04 MED ORDER — ONDANSETRON 4 MG PO TBDP: 4.0000 mg | ORAL_TABLET | Freq: Three times a day (TID) | ORAL | 0 refills | Status: DC | PRN

## 2020-04-04 MED ORDER — ONDANSETRON 4 MG PO TBDP
4.0000 mg | ORAL_TABLET | Freq: Once | ORAL | Status: AC
  Administered 2020-04-04: 4 mg via ORAL
  Filled 2020-04-04: qty 1

## 2020-04-04 NOTE — ED Notes (Signed)
Pt reporting in the lobby that she has worsening chest pain from arrival. Pt reports feeling palpitations and a "pounding" in her chest.   Blood work and EKG performed.
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
## 2021-07-23 ENCOUNTER — Emergency Department
Admission: EM | Admit: 2021-07-23 | Discharge: 2021-07-23 | Disposition: A | Discharge status: Home / Self Care | Payer: Medicaid Other | Attending: Emergency Medicine | Admitting: Emergency Medicine

## 2021-07-23 ENCOUNTER — Other Ambulatory Visit: Payer: Self-pay

## 2021-07-23 ENCOUNTER — Ambulatory Visit: Payer: Self-pay | Admitting: *Deleted

## 2021-07-23 ENCOUNTER — Encounter: Payer: Self-pay | Admitting: Emergency Medicine

## 2021-07-23 DIAGNOSIS — R55 Syncope and collapse: Secondary | ICD-10-CM | POA: Insufficient documentation

## 2021-07-23 DIAGNOSIS — F17210 Nicotine dependence, cigarettes, uncomplicated: Secondary | ICD-10-CM | POA: Insufficient documentation

## 2021-07-23 LAB — BASIC METABOLIC PANEL
Anion gap: 9 (ref 5–15)
BUN: 5 mg/dL — ABNORMAL LOW (ref 6–20)
CO2: 21 mmol/L — ABNORMAL LOW (ref 22–32)
Calcium: 8.9 mg/dL (ref 8.9–10.3)
Chloride: 107 mmol/L (ref 98–111)
Creatinine, Ser: 0.76 mg/dL (ref 0.44–1.00)
GFR, Estimated: >60 mL/min (ref >60)
Glucose, Bld: 98 mg/dL (ref 70–99)
Potassium: 3.7 mmol/L (ref 3.5–5.1)
Sodium: 137 mmol/L (ref 135–145)

## 2021-07-23 LAB — CBC
HCT: 45.2 % (ref 36.0–46.0)
Hemoglobin: 16 g/dL — ABNORMAL HIGH (ref 12.0–15.0)
MCH: 35.2 pg — ABNORMAL HIGH (ref 26.0–34.0)
MCHC: 35.4 g/dL (ref 30.0–36.0)
MCV: 99.3 fL (ref 80.0–100.0)
Platelets: 142 10*3/uL — ABNORMAL LOW (ref 150–400)
RBC: 4.55 MIL/uL (ref 3.87–5.11)
RDW: 11.9 % (ref 11.5–15.5)
WBC: 5.7 10*3/uL (ref 4.0–10.5)
nRBC: 0 % (ref 0.0–0.2)
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
## 2021-07-23 NOTE — ED Triage Notes (Signed)
Pt in via EMS from home with c/o possible seizure. Pt got up at 0400 to use the restroom and everything went black. Pt reports since then she has felt groggy and dizzy since then and that is usually how she feels after a seizure. Pt last seizure was 15 years ago. 139/92, 94 HR, 99% RA, FSBS 114

## 2021-07-23 NOTE — Telephone Encounter (Signed)
Patient c/o fainting this am at 4:30- 5 am going to the bathroom and "blacked out" . Reports "everything went black" "feels like the life has been sucked out of me". Reports pounding headache, feeling numb all over. Patient reports possibly unconscious 7-8 minutes before her husband found her on floor. Reports feeling like she was going to vomit but did not. Patient reports she is still feeling weak. Instructed patient and her husband to call 911 due to continued symptoms and to get patient evaluated in ED now. Patient denies any seizure activity. Hx of seizures . Care advise given. Patient and husband verbalized understanding of care advise and to call 911 now.

## 2021-07-23 NOTE — Telephone Encounter (Signed)
Reason for Disposition  [1] Fainted > 15 minutes ago AND [2] still feels too weak or dizzy to stand  Answer Assessment - Initial Assessment Questions 1. ONSET: "How long were you unconscious?" (minutes) "When did it happen?"     Possibley 7-8 minutes 2. CONTENT: "What happened during period of unconsciousness?" (e.g., seizure activity)      Not sure woke up at 4:30-5 am going to bathroom and everything went black and husband found patient on the floor 3. MENTAL STATUS: "Alert and oriented now?" (oriented x 3 = name, month, location)      A&O x 3 now  4. TRIGGER: "What do you think caused the fainting?" "What were you doing just before you fainted?"  (e.g., exercise, sudden standing up, prolonged standing)     Not sure  5. RECURRENT SYMPTOM: "Have you ever passed out before?" If Yes, ask: "When was the last time?" and "What happened that time?"      Hx seizures  6. INJURY: "Did you sustain any injury during the fall?"      Does not think so  7. CARDIAC SYMPTOMS: "Have you had any of the following symptoms: chest pain, difficulty breathing, palpitations?"     Denies  8. NEUROLOGIC SYMPTOMS: "Have you had any of the following symptoms: headache, numbness, vertigo, weakness?"     Pounding headache, weakness 9. GI SYMPTOMS: "Have you had any of the following symptoms: abdominal pain, vomiting, diarrhea, blood in stools?"    Felt like vomiting but did not  10. OTHER SYMPTOMS: "Do you have any other symptoms?"       "Feels like...  had the life sucked out of me"  11. PREGNANCY: "Is there any chance you are pregnant?" "When was your last menstrual period?"       na  Protocols used: Fainting-A-AH

## 2021-07-24 ENCOUNTER — Telehealth: Payer: Self-pay

## 2021-07-24 NOTE — Telephone Encounter (Signed)
Copied from CRM [id number]. Topic: Appointment Scheduling - Scheduling Inquiry for Clinic >> Jul 24, 2021 10:55 AM [name] wrote: Reason for CRM: Pt called and would like to schedule a Hospital Follow up as soon as possible. Please advise. Patient will see anyone.

## 2021-07-24 NOTE — Telephone Encounter (Signed)
Apt 8:40 08/01/2021   Thanks,   -Vernona Rieger

## 2021-08-01 ENCOUNTER — Ambulatory Visit (INDEPENDENT_AMBULATORY_CARE_PROVIDER_SITE_OTHER): Payer: Self-pay | Admitting: Family Medicine

## 2021-08-01 ENCOUNTER — Other Ambulatory Visit: Payer: Self-pay

## 2021-08-01 ENCOUNTER — Encounter: Payer: Self-pay | Admitting: Family Medicine

## 2021-08-01 VITALS — BP 112/75 | HR 73 | Temp 98.4°F | Resp 16 | Wt 129.2 lb

## 2021-08-01 DIAGNOSIS — Z716 Tobacco abuse counseling: Secondary | ICD-10-CM | POA: Insufficient documentation

## 2021-08-01 DIAGNOSIS — F419 Anxiety disorder, unspecified: Secondary | ICD-10-CM | POA: Insufficient documentation

## 2021-08-01 DIAGNOSIS — F32A Depression, unspecified: Secondary | ICD-10-CM | POA: Insufficient documentation

## 2021-08-01 DIAGNOSIS — Z87898 Personal history of other specified conditions: Secondary | ICD-10-CM

## 2021-08-01 DIAGNOSIS — R55 Syncope and collapse: Secondary | ICD-10-CM | POA: Insufficient documentation

## 2021-08-01 MED ORDER — HYDROXYZINE HCL 10 MG PO TABS
10.0000 mg | ORAL_TABLET | Freq: Three times a day (TID) | ORAL | 0 refills | Status: DC | PRN
Start: 2021-08-01 — End: 2021-08-21

## 2021-08-01 NOTE — Assessment & Plan Note (Signed)
Worsening anxiety and depression Start hydroxyzine at this time Fearful of SEs of starting daily medication Doesn't want to be on medication that needs titration

## 2021-08-01 NOTE — Assessment & Plan Note (Signed)
Remote history Last seizure 2008 Syncope event Summer 2022

## 2021-08-01 NOTE — Assessment & Plan Note (Signed)
Would like to reduce and quit Dad died of lung cancer Discussed strategies to quit No formal plan set in place at this time

## 2021-08-01 NOTE — Progress Notes (Signed)
Established patient visit   Patient: Kaitlin Phillips   DOB: 19-Jul-1986   35 y.o. Female  MRN: [id number] Visit Date: 08/01/2021  Today's healthcare provider: Jacky Kindle, FNP   Chief Complaint  Patient presents with   ER follow up   Subjective  -------------------------------------------------------------------------------------------------------------------- HPI  Follow up ER visit  Patient was seen in ER for possible seizure versus syncopal episode on 07/23/21. She was treated for syncope. Treatment for this included Ekg and Labs ordered, patient to follow up with a neurologist. She reports satisfactory compliance with treatment. She reports this condition is Unchanged. Patient reports since being discharged from hospital she has had the following symptoms: lightheaded/dizzy, fatigue, nausea/vomiting, feeling weak, arthralgia and insomnia. Symptoms are nearly gone today upon waking.  -----------------------------------------------------------------------------------------      Medications: Outpatient Medications Prior to Visit  Medication Sig   Cyanocobalamin (VITAMIN B-12 PO) Take by mouth.   levonorgestrel (MIRENA, 52 MG,) 20 MCG/DAY IUD 1 each by Intrauterine route once.   [DISCONTINUED] pantoprazole (PROTONIX) 40 MG tablet Take 40 mg by mouth daily.   levonorgestrel (MIRENA) 20 MCG/24HR IUD 1 Intra Uterine Device (1 each total) by Intrauterine route once for 1 dose.   [DISCONTINUED] lidocaine (XYLOCAINE) 2 % solution Use as directed 15 mLs in the mouth or throat every 6 (six) hours as needed (abd pain). (Patient not taking: Reported on 08/01/2021)   [DISCONTINUED] ondansetron (ZOFRAN ODT) 4 MG disintegrating tablet Take 1 tablet (4 mg total) by mouth every 8 (eight) hours as needed. (Patient not taking: Reported on 08/01/2021)   [DISCONTINUED] oxyCODONE-acetaminophen (PERCOCET) 5-325 MG tablet Take 1 tablet by mouth every 4 (four) hours as needed. (Patient not taking:  Reported on 08/01/2021)   [DISCONTINUED] pantoprazole (PROTONIX) 20 MG tablet Take 1 tablet (20 mg total) by mouth daily.   [DISCONTINUED] predniSONE (STERAPRED UNI-PAK 21 TAB) 10 MG (21) TBPK tablet Take 6 pills on day 1, 5 pills on day 2, and so on until complete. Start on 05/19/2018. (Patient not taking: Reported on 08/01/2021)   [DISCONTINUED] sucralfate (CARAFATE) 1 g tablet Take 1 tablet (1 g total) by mouth 4 (four) times daily. (Patient not taking: Reported on 08/01/2021)   [DISCONTINUED] venlafaxine XR (EFFEXOR-XR) 75 MG 24 hr capsule Take 1 capsule (75 mg total) by mouth daily. (Patient not taking: Reported on 08/01/2021)   [DISCONTINUED] methylPREDNISolone acetate (DEPO-MEDROL) injection 80 mg    No facility-administered medications prior to visit.    Review of Systems     Objective  -------------------------------------------------------------------------------------------------------------------- BP 112/75   Pulse 73   Temp 98.4 F (36.9 C) (Oral)   Resp 16   Wt 129 lb 3.2 oz (58.6 kg)   SpO2 100%   BMI 21.50 kg/m     Physical Exam Vitals and nursing note reviewed.  Constitutional:      General: She is not in acute distress.    Appearance: Normal appearance. She is normal weight. She is not ill-appearing, toxic-appearing or diaphoretic.  HENT:     Head: Normocephalic.  Eyes:     Pupils: Pupils are equal, round, and reactive to light.  Neck:     Vascular: No carotid bruit.  Cardiovascular:     Rate and Rhythm: Normal rate and regular rhythm.     Pulses: Normal pulses.     Heart sounds: Normal heart sounds. No murmur heard.   No friction rub. No gallop.  Pulmonary:     Effort: Pulmonary effort is normal. No respiratory distress.  Breath sounds: Normal breath sounds. No stridor. No wheezing, rhonchi or rales.  Chest:     Chest wall: No tenderness.  Abdominal:     Palpations: Abdomen is soft.  Musculoskeletal:        General: Normal range of motion.      Cervical back: Normal range of motion and neck supple. No rigidity or tenderness.  Lymphadenopathy:     Cervical: No cervical adenopathy.  Skin:    General: Skin is warm and dry.     Capillary Refill: Capillary refill takes less than 2 seconds.     Coloration: Skin is not jaundiced or pale.     Findings: No bruising, erythema, lesion or rash.  Neurological:     General: No focal deficit present.     Mental Status: She is alert and oriented to person, place, and time. Mental status is at baseline.     Cranial Nerves: No cranial nerve deficit.     Sensory: No sensory deficit.     Motor: No weakness.     Coordination: Coordination normal.     Gait: Gait normal.     Deep Tendon Reflexes: Reflexes normal.  Psychiatric:        Attention and Perception: Attention and perception normal. She is attentive.        Mood and Affect: Mood is anxious. Mood is not depressed. Affect is tearful. Affect is not flat.        Speech: Speech normal.        Behavior: Behavior normal. Behavior is cooperative.        Thought Content: Thought content normal.        Cognition and Memory: Cognition and memory normal.        Judgment: Judgment normal.     No results found for any visits on 08/01/21.  Assessment & Plan  ---------------------------------------------------------------------------------------------------------------------- Problem List Items Addressed This Visit       Other   History of seizures    Remote history Last seizure 2008 Syncope event Summer 2022      Tobacco abuse counseling - Primary    Would like to reduce and quit Dad died of lung cancer Discussed strategies to quit No formal plan set in place at this time      Anxiety and depression    Worsening anxiety and depression Start hydroxyzine at this time Fearful of SEs of starting daily medication Doesn't want to be on medication that needs titration      Relevant Medications   hydrOXYzine (ATARAX/VISTARIL) 10 MG tablet    Syncope and collapse    Discussed events surrounding as well as diet Dehydration and stress and sleep disturbances noted Discussed healthful eating, as well as salt avoidance within dehydration Lightheadedness is resolved at this point Felt drained- for a week Overall stressful social situation and isolation        Return in about 6 weeks (around 09/12/2021) for anxiety and depression.      Leilani Merl, FNP, have reviewed all documentation for this visit. The documentation on 08/01/21 for the exam, diagnosis, procedures, and orders are all accurate and complete.    Jacky Kindle, FNP  [facility] [phone] (phone) [phone] (fax)  [facility]

## 2021-08-01 NOTE — Assessment & Plan Note (Signed)
Discussed events surrounding as well as diet Dehydration and stress and sleep disturbances noted Discussed healthful eating, as well as salt avoidance within dehydration Lightheadedness is resolved at this point Felt drained- for a week Overall stressful social situation and isolation

## 2021-08-21 ENCOUNTER — Other Ambulatory Visit: Payer: Self-pay | Admitting: Family Medicine

## 2021-08-21 DIAGNOSIS — F419 Anxiety disorder, unspecified: Secondary | ICD-10-CM

## 2021-08-21 MED ORDER — HYDROXYZINE PAMOATE 25 MG PO CAPS: 25.0000 mg | ORAL_CAPSULE | Freq: Three times a day (TID) | ORAL | 3 refills | Status: DC

## 2022-02-01 IMAGING — CT CT MAXILLOFACIAL W/O CM
3 series · 16 of 47 positions shown, 19 images · non-contrast
Comparison: None.

CLINICAL DATA: Status post trauma.

EXAM:
CT HEAD WITHOUT CONTRAST
CT MAXILLOFACIAL WITHOUT CONTRAST
TECHNIQUE: Multidetector CT imaging of the head and maxillofacial structures
were performed using the standard protocol without intravenous
contrast. Multiplanar CT image reconstructions of the maxillofacial
structures were also generated.

[Series 2: max soft · axial · 0.33mm/px · z∈[+10,+154]mm · 10 of 84 slices shown, 13 images]
[im 6/84  brain]
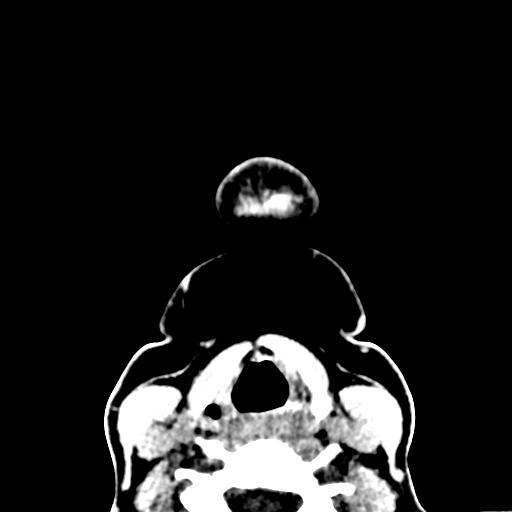
[im 6/84  bone]
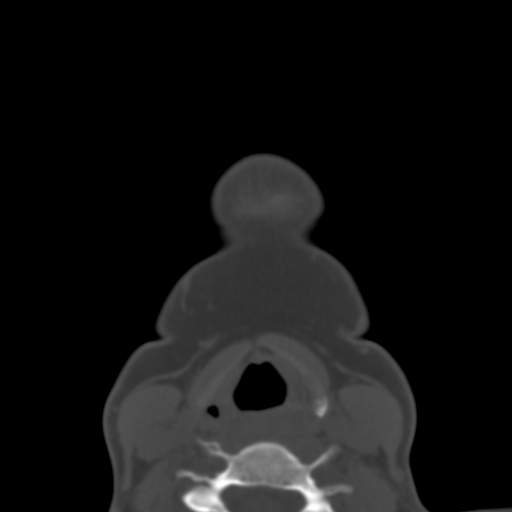
[im 15/84  bone]
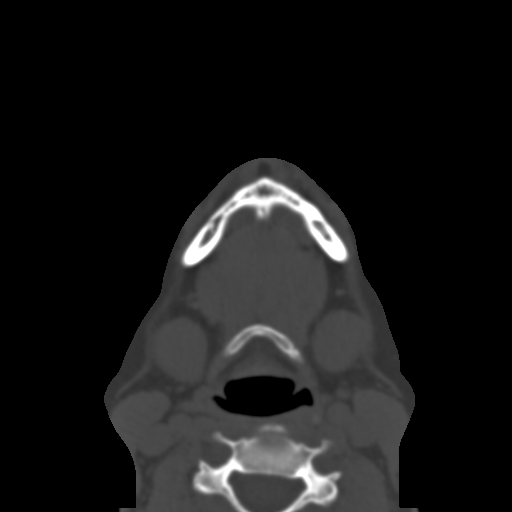
[im 23/84  bone]
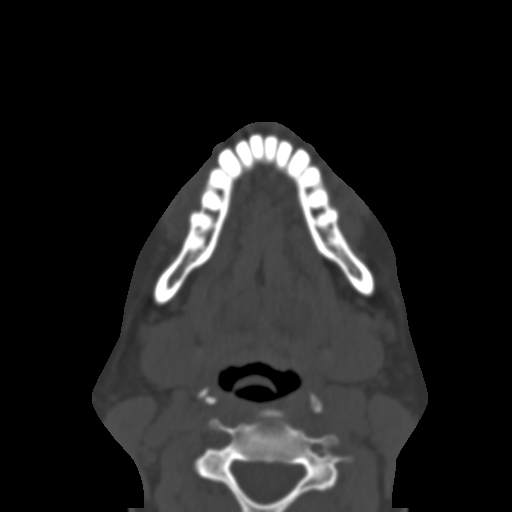
[im 29/84  bone]
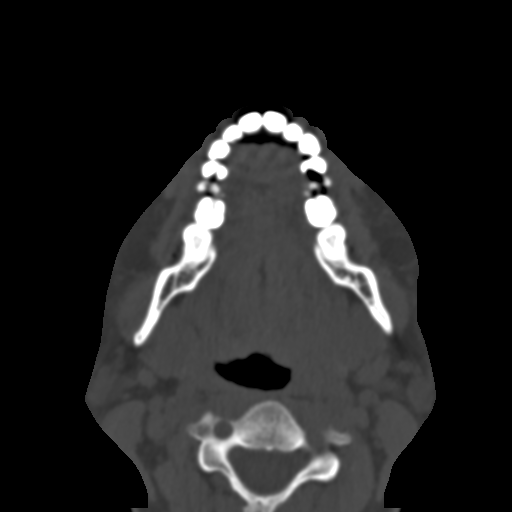
[im 38/84  brain]
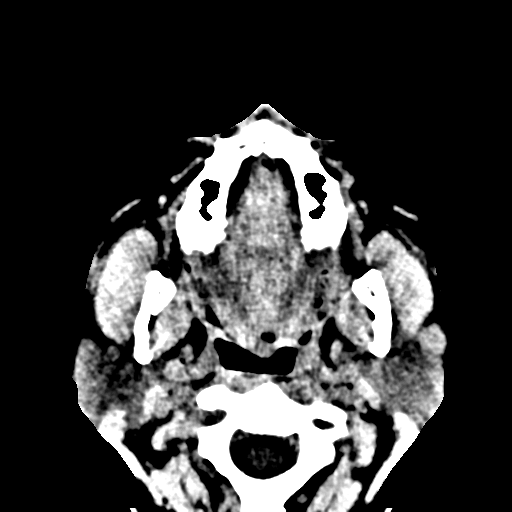
[im 38/84  bone]
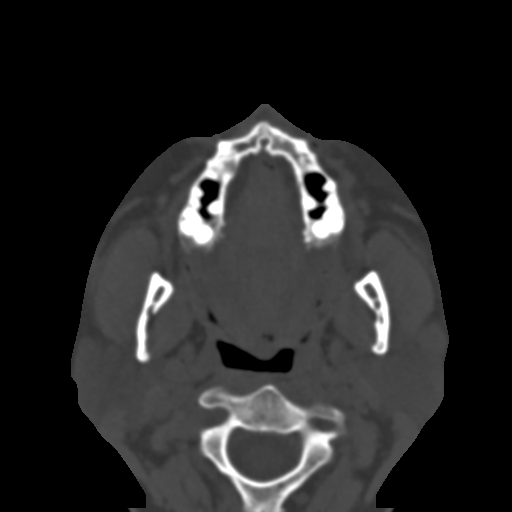
[im 46/84  bone]
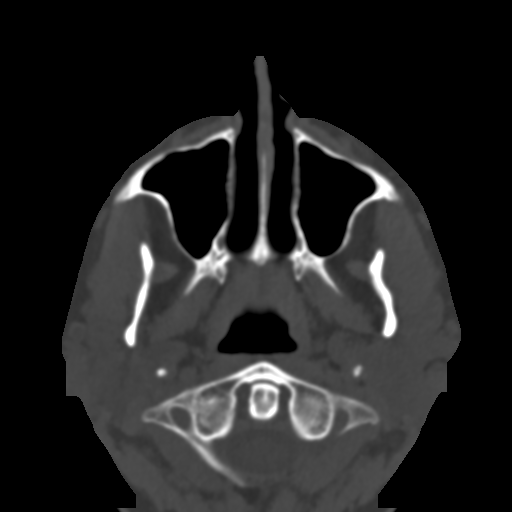
[im 55/84  bone]
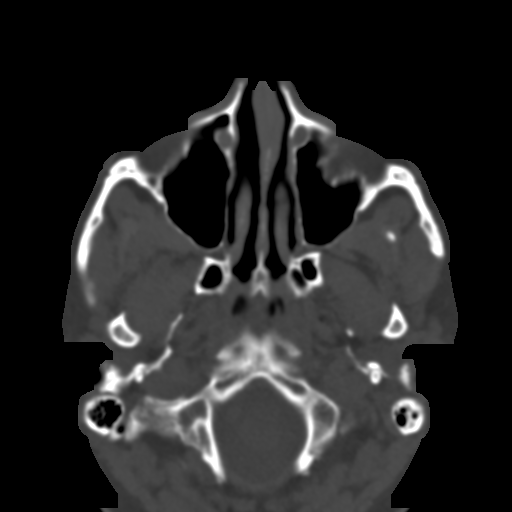
[im 63/84  bone]
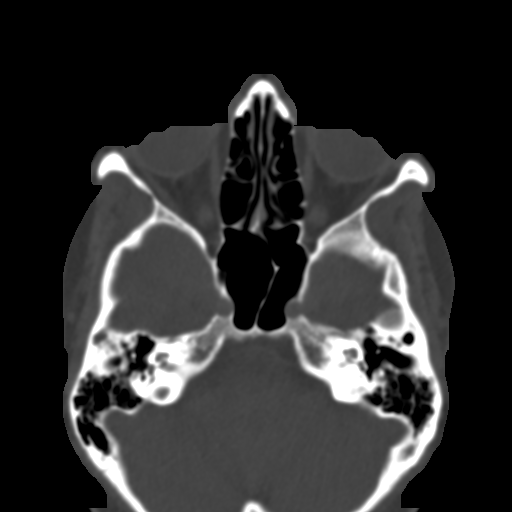
[im 69/84  brain]
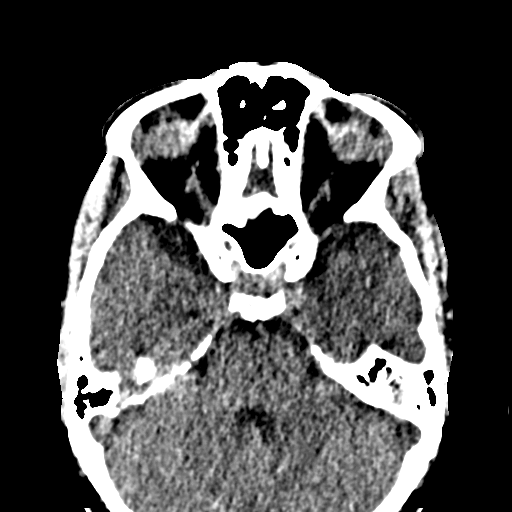
[im 69/84  bone]
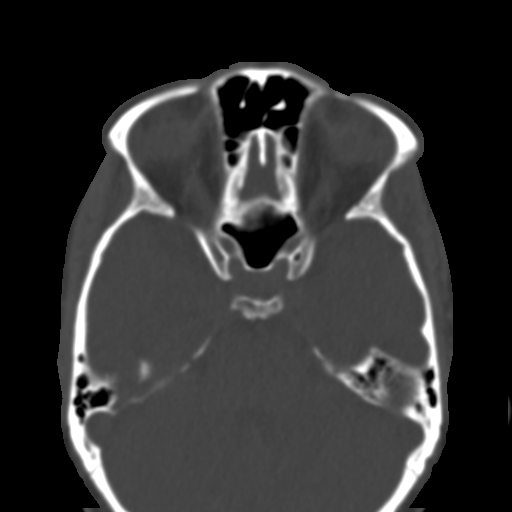
[im 78/84  bone]
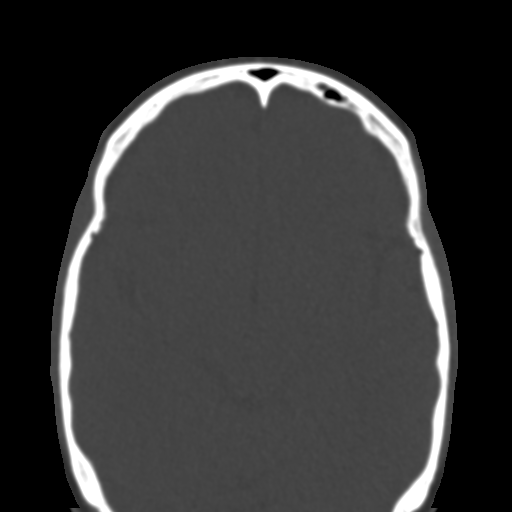

[Series 6: coronal soft · coronal · 0.32mm/px · 3 of 74 slices shown]
[im 25/74  bone]
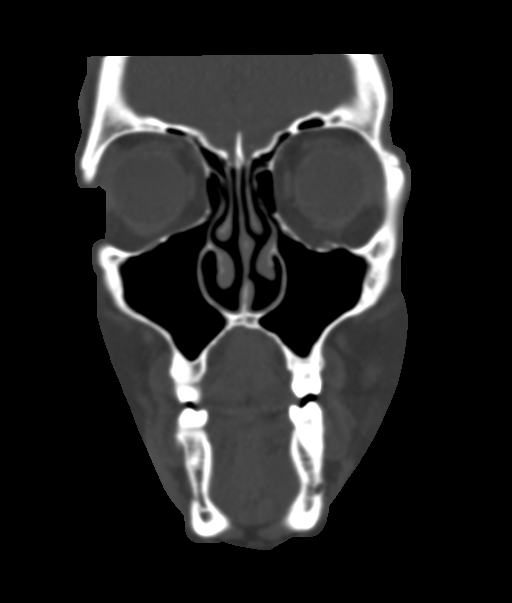
[im 33/74  bone]
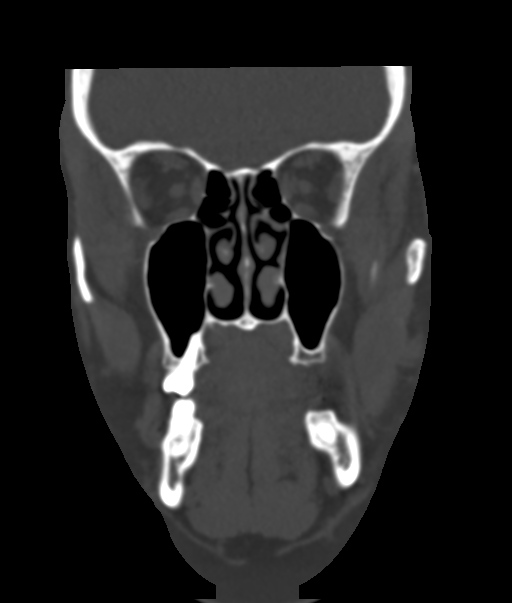
[im 41/74  bone]
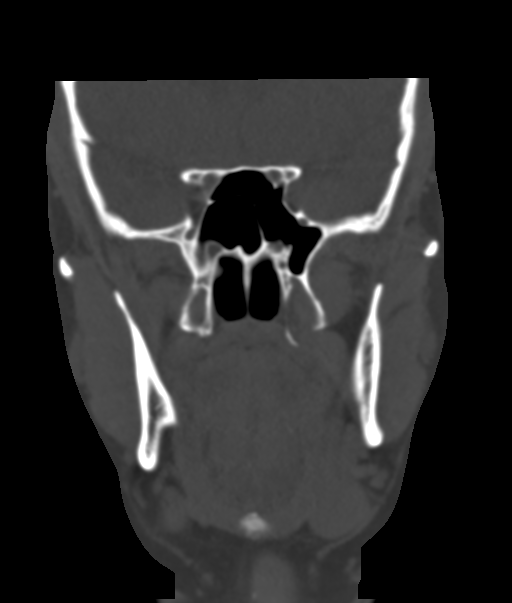

[Series 7: sagittal soft · sagittal · 0.36mm/px · 3 of 73 slices shown]
[im 25/73  bone]
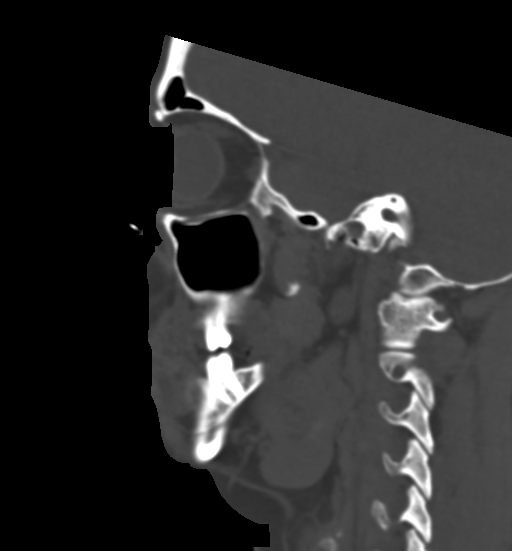
[im 37/73  bone]
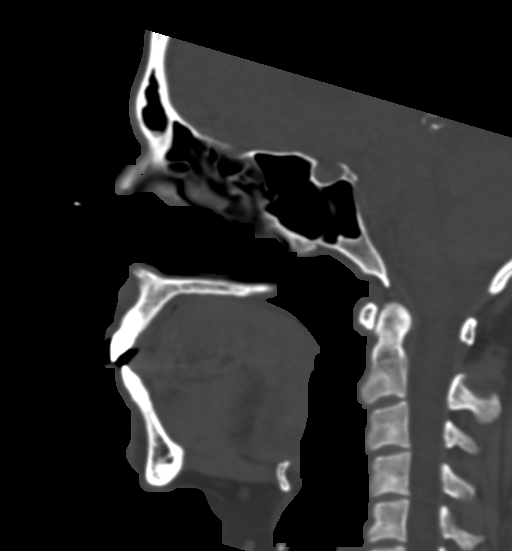
[im 49/73  bone]
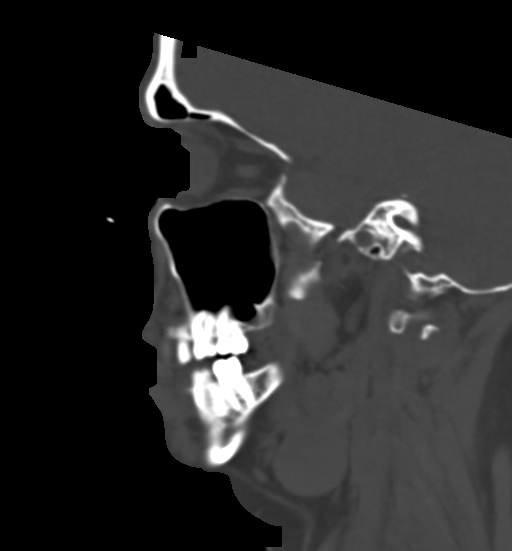

[16 of 47 positions shown; findings below may reference images not displayed]

FINDINGS: CT HEAD FINDINGS

Brain: No evidence of acute infarction, hemorrhage, hydrocephalus,
extra-axial collection or mass lesion/mass effect.

Pineal gland calcification is noted.

Vascular: No hyperdense vessel is identified.

Skull: Normal. Negative for fracture or focal lesion.

Other: There is mild right frontal scalp soft tissue swelling.

CT MAXILLOFACIAL FINDINGS

Osseous: No fracture or mandibular dislocation. No destructive
process.

Orbits: Negative. No traumatic or inflammatory finding.

Sinuses: Clear.

Soft tissues: There is mild right frontal scalp soft tissue
swelling.
IMPRESSION: 1. No acute intracranial process.
2. Mild right frontal scalp soft tissue swelling.
3. No evidence of acute facial fracture.

## 2022-02-01 IMAGING — CT CT CERVICAL SPINE W/O CM
3 of 4 series · 12 of 33 positions shown, 14 images · non-contrast
Comparison: December 21, 2006

CLINICAL DATA: Status post trauma.

EXAM:
CT CERVICAL SPINE WITHOUT CONTRAST
TECHNIQUE: Multidetector CT imaging of the cervical spine was performed without
intravenous contrast. Multiplanar CT image reconstructions were also
generated.

[Series 4: sagittal bone · sagittal · 0.22mm/px · 5 of 48 slices shown, 6 images]
[im 16/48  bone]
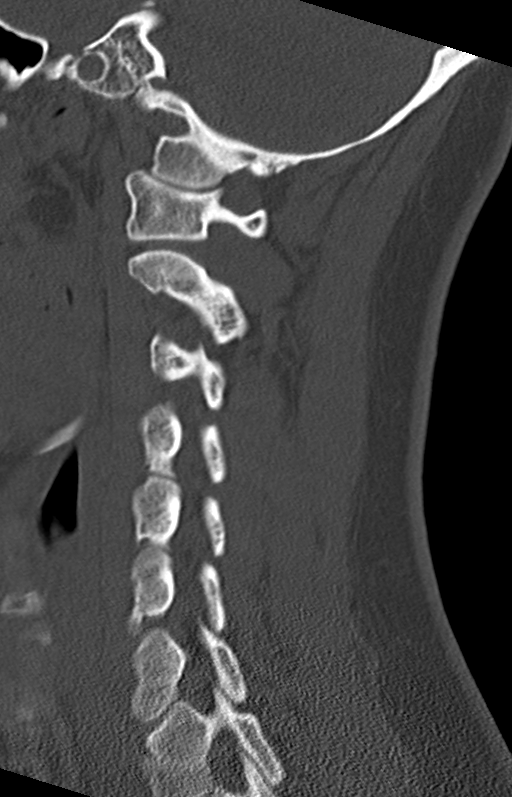
[im 20/48  bone]
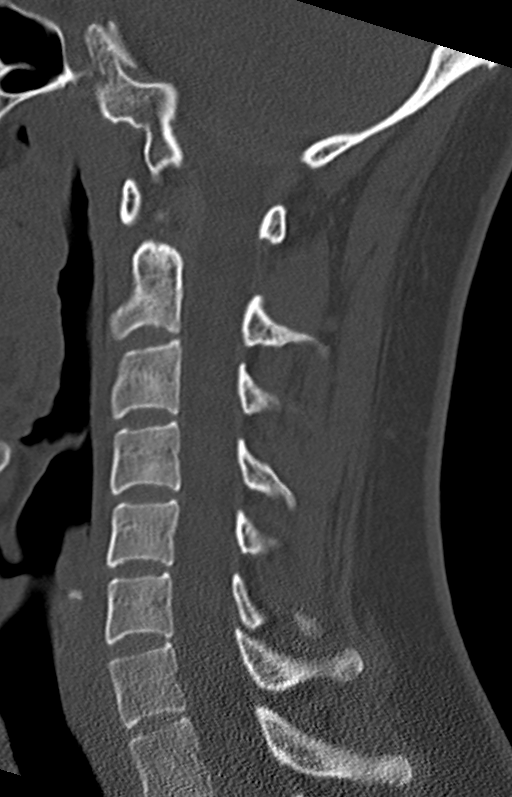
[im 24/48  soft-tissue]
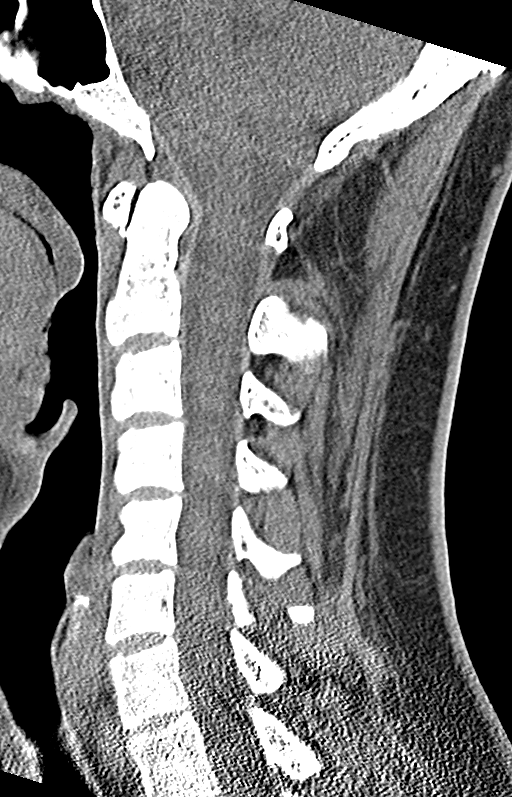
[im 24/48  bone]
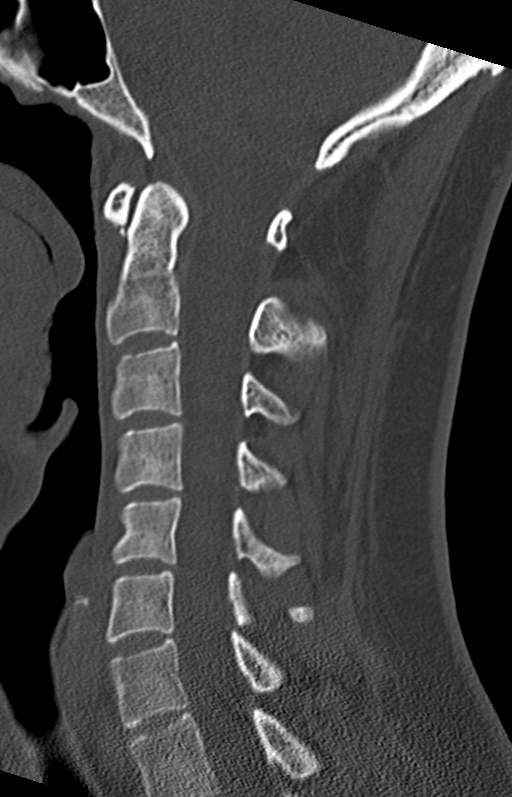
[im 28/48  bone]
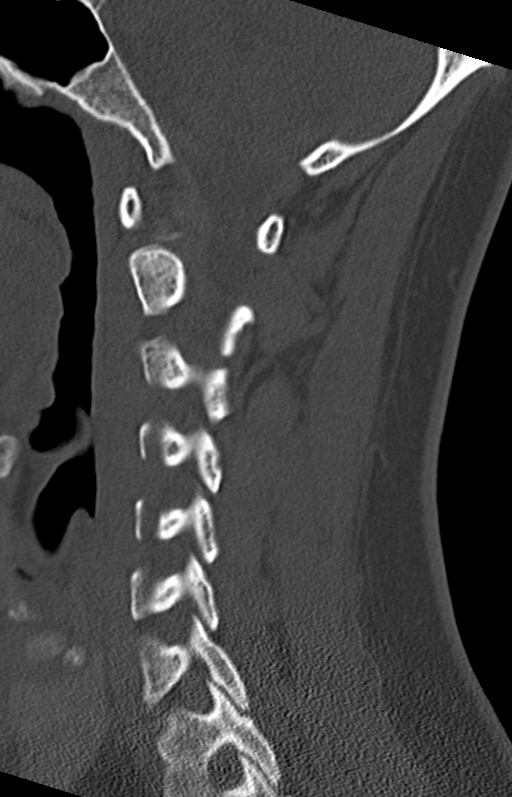
[im 32/48  bone]
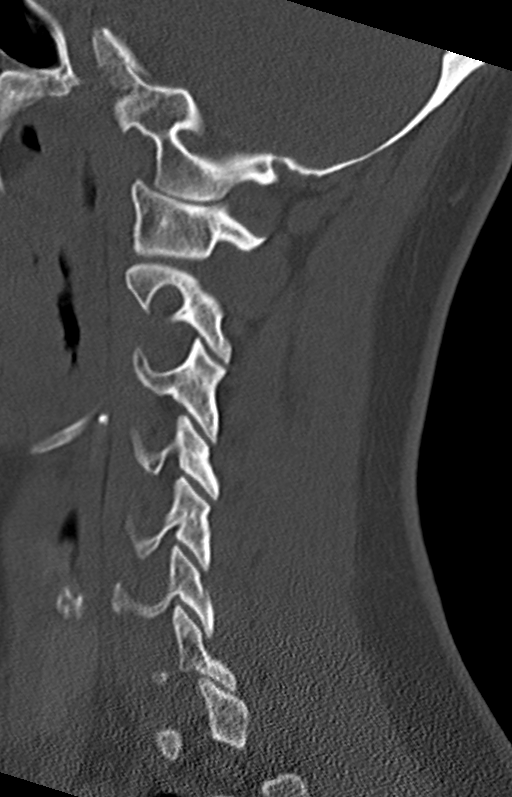

[Series 5: coronal bone · coronal · 0.21mm/px · 3 of 41 slices shown]
[im 9/41  bone]
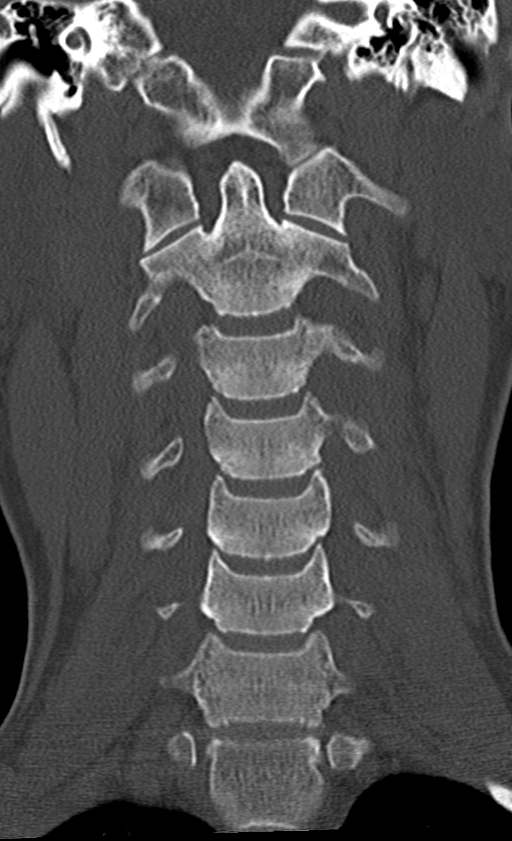
[im 17/41  bone]
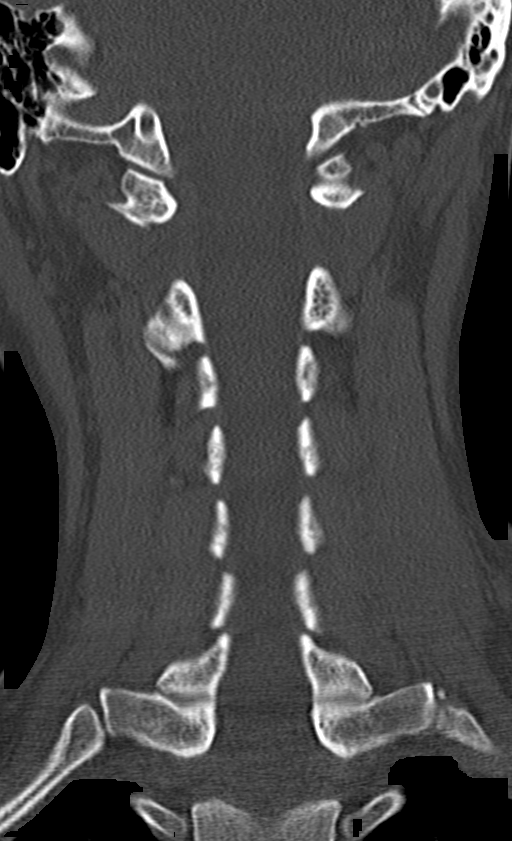
[im 25/41  bone]
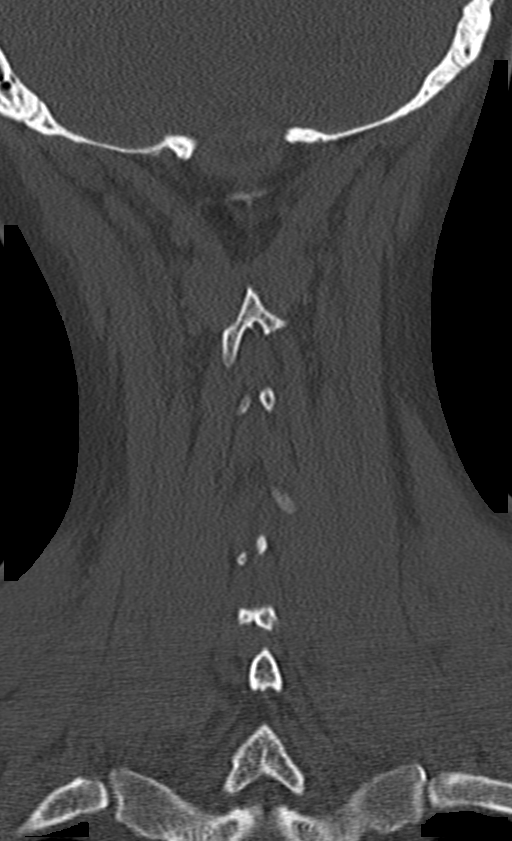

[Series 6: orthogonal bone · axial · 0.19mm/px · z∈[-20,+92]mm · 4 of 90 slices shown, 5 images]
[im 15/90  soft-tissue]
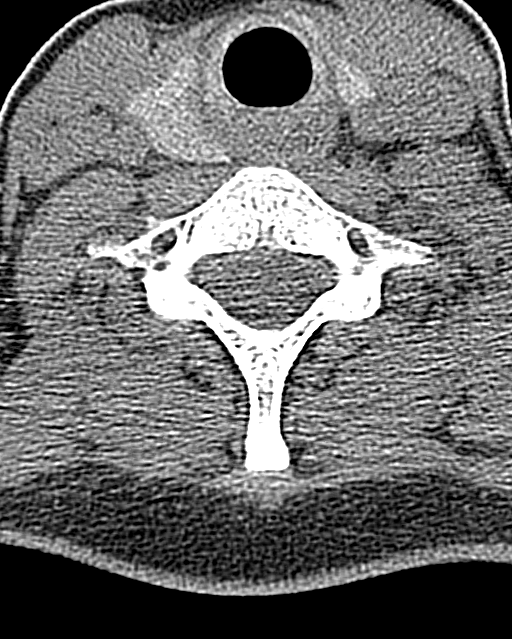
[im 15/90  bone]
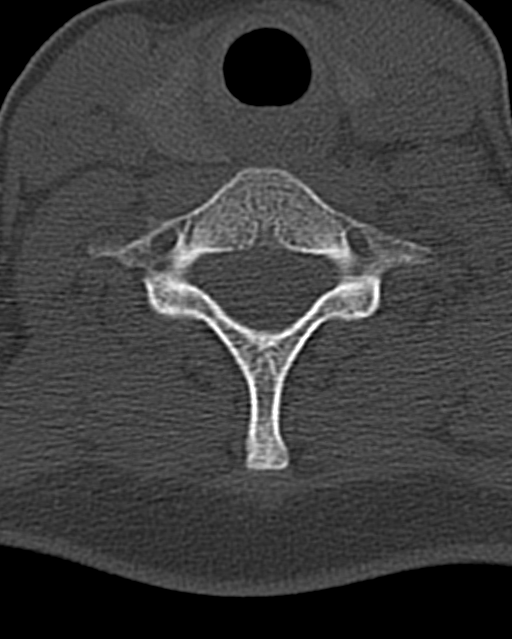
[im 30/90  bone]
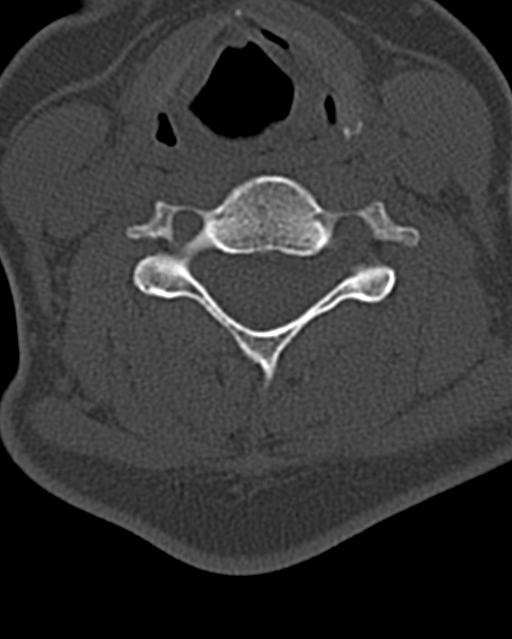
[im 60/90  bone]
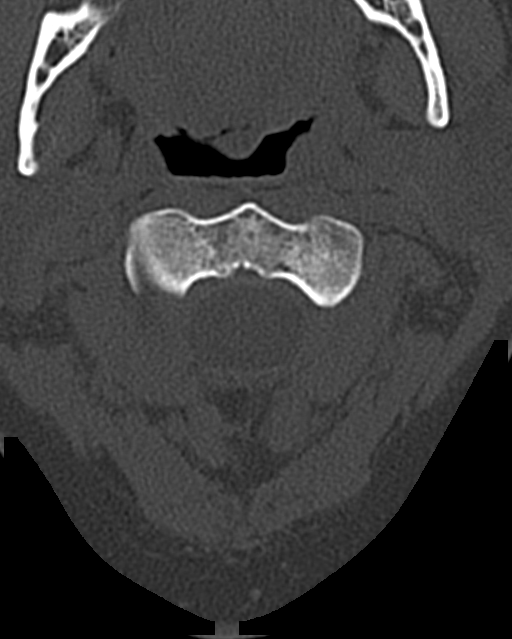
[im 75/90  bone]
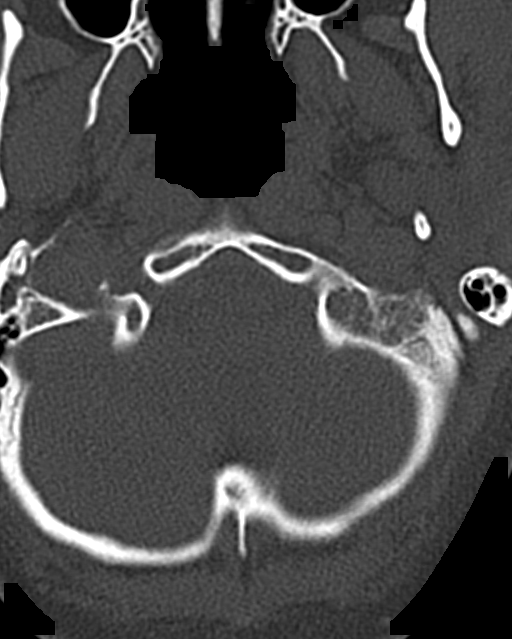

[12 of 33 positions shown; findings below may reference images not displayed]

FINDINGS: Alignment: Normal.

Skull base and vertebrae: No acute fracture. No primary bone lesion
or focal pathologic process.

Soft tissues and spinal canal: No prevertebral fluid or swelling. No
visible canal hematoma.

Disc levels: Normal multilevel endplate sclerosis is seen with
normal multilevel intervertebral disc spaces.

Upper chest: Acute fracture deformity is seen along the medial
aspect of the second right rib.

Other: None.
IMPRESSION: 1. No acute cervical spine fracture.
2. Acute fracture deformity along the medial aspect of the second
right rib.

## 2023-05-04 DIAGNOSIS — F17210 Nicotine dependence, cigarettes, uncomplicated: Secondary | ICD-10-CM | POA: Diagnosis not present

## 2023-05-04 DIAGNOSIS — F1024 Alcohol dependence with alcohol-induced mood disorder: Secondary | ICD-10-CM | POA: Diagnosis not present

## 2023-05-04 DIAGNOSIS — F10239 Alcohol dependence with withdrawal, unspecified: Secondary | ICD-10-CM | POA: Diagnosis not present

## 2023-05-04 DIAGNOSIS — E876 Hypokalemia: Secondary | ICD-10-CM | POA: Diagnosis not present

## 2023-05-04 DIAGNOSIS — F1490 Cocaine use, unspecified, uncomplicated: Secondary | ICD-10-CM | POA: Diagnosis not present

## 2023-05-04 DIAGNOSIS — F1020 Alcohol dependence, uncomplicated: Secondary | ICD-10-CM | POA: Diagnosis not present

## 2023-05-04 DIAGNOSIS — Z609 Problem related to social environment, unspecified: Secondary | ICD-10-CM | POA: Diagnosis not present

## 2023-05-04 DIAGNOSIS — F17200 Nicotine dependence, unspecified, uncomplicated: Secondary | ICD-10-CM | POA: Diagnosis not present

## 2023-05-04 DIAGNOSIS — K5900 Constipation, unspecified: Secondary | ICD-10-CM | POA: Diagnosis not present

## 2023-05-04 DIAGNOSIS — Y904 Blood alcohol level of 80-99 mg/100 ml: Secondary | ICD-10-CM | POA: Diagnosis not present

## 2023-05-04 DIAGNOSIS — Z599 Problem related to housing and economic circumstances, unspecified: Secondary | ICD-10-CM | POA: Diagnosis not present

## 2023-05-04 DIAGNOSIS — F331 Major depressive disorder, recurrent, moderate: Secondary | ICD-10-CM | POA: Diagnosis not present

## 2023-05-04 DIAGNOSIS — F419 Anxiety disorder, unspecified: Secondary | ICD-10-CM | POA: Diagnosis not present

## 2023-05-04 DIAGNOSIS — R7401 Elevation of levels of liver transaminase levels: Secondary | ICD-10-CM | POA: Diagnosis not present

## 2023-05-04 DIAGNOSIS — F10929 Alcohol use, unspecified with intoxication, unspecified: Secondary | ICD-10-CM | POA: Diagnosis not present

## 2023-05-04 DIAGNOSIS — Z79899 Other long term (current) drug therapy: Secondary | ICD-10-CM | POA: Diagnosis not present

## 2023-05-04 DIAGNOSIS — F32A Depression, unspecified: Secondary | ICD-10-CM | POA: Diagnosis not present

## 2023-05-08 DIAGNOSIS — F329 Major depressive disorder, single episode, unspecified: Secondary | ICD-10-CM | POA: Diagnosis not present

## 2023-05-08 DIAGNOSIS — F419 Anxiety disorder, unspecified: Secondary | ICD-10-CM | POA: Diagnosis not present

## 2023-05-08 DIAGNOSIS — F1020 Alcohol dependence, uncomplicated: Secondary | ICD-10-CM | POA: Diagnosis not present

## 2023-05-08 DIAGNOSIS — K5900 Constipation, unspecified: Secondary | ICD-10-CM | POA: Diagnosis not present

## 2023-05-08 DIAGNOSIS — Z9102 Food additives allergy status: Secondary | ICD-10-CM | POA: Diagnosis not present

## 2023-05-08 DIAGNOSIS — G4700 Insomnia, unspecified: Secondary | ICD-10-CM | POA: Diagnosis not present

## 2023-05-08 DIAGNOSIS — F17200 Nicotine dependence, unspecified, uncomplicated: Secondary | ICD-10-CM | POA: Diagnosis not present

## 2023-05-08 DIAGNOSIS — K219 Gastro-esophageal reflux disease without esophagitis: Secondary | ICD-10-CM | POA: Diagnosis not present

## 2023-05-08 DIAGNOSIS — Z8659 Personal history of other mental and behavioral disorders: Secondary | ICD-10-CM | POA: Diagnosis not present

## 2023-05-08 DIAGNOSIS — R7401 Elevation of levels of liver transaminase levels: Secondary | ICD-10-CM | POA: Diagnosis not present

## 2023-05-08 DIAGNOSIS — G4089 Other seizures: Secondary | ICD-10-CM | POA: Diagnosis not present

## 2023-05-08 DIAGNOSIS — F1410 Cocaine abuse, uncomplicated: Secondary | ICD-10-CM | POA: Diagnosis not present

## 2023-05-08 DIAGNOSIS — R4586 Emotional lability: Secondary | ICD-10-CM | POA: Diagnosis not present

## 2023-05-08 DIAGNOSIS — Z87820 Personal history of traumatic brain injury: Secondary | ICD-10-CM | POA: Diagnosis not present

## 2023-05-12 ENCOUNTER — Telehealth: Payer: Self-pay

## 2023-05-12 NOTE — Transitions of Care (Post Inpatient/ED Visit) (Signed)
   05/12/2023  Name: Kaitlin Phillips MRN: [id number] DOB: 17-Jun-1986  Today's TOC FU Call Status: Today's TOC FU Call Status:: Unsuccessul Call (1st Attempt) Unsuccessful Call (1st Attempt) Date: 05/12/23  Transition Care Management Follow-up Telephone Call Date of Discharge: 05/08/23 Discharge Facility: [facility] Type of Discharge: Inpatient Admission Primary Inpatient Discharge Diagnosis:: alcohol/cocaine use  Items Reviewed:    Medications Reviewed Today: Medications Reviewed Today     Reviewed by Jacky Kindle, FNP (Family Nurse Practitioner) on 08/01/21 at [phone]  Med List Status: <None>   Medication Order Taking? Sig Documenting Provider Last Dose Status Informant  Cyanocobalamin (VITAMIN B-12 PO) [id number] Yes Take by mouth. [provider] Taking Active   hydrOXYzine (ATARAX/VISTARIL) 10 MG tablet [id number] Yes Take 1 tablet (10 mg total) by mouth 3 (three) times daily as needed. Jacky Kindle, FNP  Active   levonorgestrel (MIRENA) 20 MCG/24HR IUD [id number]  1 Intra Uterine Device (1 each total) by Intrauterine route once for 1 dose. Conard Novak, MD  Expired 03/09/19 2359   levonorgestrel (MIRENA, 52 MG,) 20 MCG/DAY IUD [id number] Yes 1 each by Intrauterine route once. [provider] Taking Active             Home Care and Equipment/Supplies:    Functional Questionnaire:    Follow up appointments reviewed:   Patient in [facility] rehab per her husband   SIGNATURE Karena Addison, LPN [facility] Nurse Health Advisor Direct Dial [phone]

## 2023-05-26 DIAGNOSIS — R7401 Elevation of levels of liver transaminase levels: Secondary | ICD-10-CM | POA: Diagnosis not present

## 2023-05-26 DIAGNOSIS — R4586 Emotional lability: Secondary | ICD-10-CM | POA: Diagnosis not present

## 2023-05-26 DIAGNOSIS — F1410 Cocaine abuse, uncomplicated: Secondary | ICD-10-CM | POA: Diagnosis not present

## 2023-05-26 DIAGNOSIS — F419 Anxiety disorder, unspecified: Secondary | ICD-10-CM | POA: Diagnosis not present

## 2023-05-26 DIAGNOSIS — K5900 Constipation, unspecified: Secondary | ICD-10-CM | POA: Diagnosis not present

## 2023-05-26 DIAGNOSIS — G4089 Other seizures: Secondary | ICD-10-CM | POA: Diagnosis not present

## 2023-05-26 DIAGNOSIS — G4700 Insomnia, unspecified: Secondary | ICD-10-CM | POA: Diagnosis not present

## 2023-05-26 DIAGNOSIS — Z9102 Food additives allergy status: Secondary | ICD-10-CM | POA: Diagnosis not present

## 2023-05-26 DIAGNOSIS — K219 Gastro-esophageal reflux disease without esophagitis: Secondary | ICD-10-CM | POA: Diagnosis not present

## 2023-05-26 DIAGNOSIS — F329 Major depressive disorder, single episode, unspecified: Secondary | ICD-10-CM | POA: Diagnosis not present

## 2023-05-26 DIAGNOSIS — F17200 Nicotine dependence, unspecified, uncomplicated: Secondary | ICD-10-CM | POA: Diagnosis not present

## 2023-05-26 DIAGNOSIS — Z8659 Personal history of other mental and behavioral disorders: Secondary | ICD-10-CM | POA: Diagnosis not present

## 2023-05-26 DIAGNOSIS — F1020 Alcohol dependence, uncomplicated: Secondary | ICD-10-CM | POA: Diagnosis not present

## 2023-05-26 DIAGNOSIS — Z87820 Personal history of traumatic brain injury: Secondary | ICD-10-CM | POA: Diagnosis not present

## 2023-05-27 DIAGNOSIS — K219 Gastro-esophageal reflux disease without esophagitis: Secondary | ICD-10-CM | POA: Diagnosis not present

## 2023-05-27 DIAGNOSIS — F1410 Cocaine abuse, uncomplicated: Secondary | ICD-10-CM | POA: Diagnosis not present

## 2023-05-27 DIAGNOSIS — F17200 Nicotine dependence, unspecified, uncomplicated: Secondary | ICD-10-CM | POA: Diagnosis not present

## 2023-05-27 DIAGNOSIS — Z9102 Food additives allergy status: Secondary | ICD-10-CM | POA: Diagnosis not present

## 2023-05-27 DIAGNOSIS — R7401 Elevation of levels of liver transaminase levels: Secondary | ICD-10-CM | POA: Diagnosis not present

## 2023-05-27 DIAGNOSIS — F1020 Alcohol dependence, uncomplicated: Secondary | ICD-10-CM | POA: Diagnosis not present

## 2023-05-27 DIAGNOSIS — G4700 Insomnia, unspecified: Secondary | ICD-10-CM | POA: Diagnosis not present

## 2023-05-27 DIAGNOSIS — G4089 Other seizures: Secondary | ICD-10-CM | POA: Diagnosis not present

## 2023-05-27 DIAGNOSIS — F419 Anxiety disorder, unspecified: Secondary | ICD-10-CM | POA: Diagnosis not present

## 2023-05-27 DIAGNOSIS — Z8659 Personal history of other mental and behavioral disorders: Secondary | ICD-10-CM | POA: Diagnosis not present

## 2023-05-27 DIAGNOSIS — Z87820 Personal history of traumatic brain injury: Secondary | ICD-10-CM | POA: Diagnosis not present

## 2023-05-27 DIAGNOSIS — R4586 Emotional lability: Secondary | ICD-10-CM | POA: Diagnosis not present

## 2023-05-27 DIAGNOSIS — F329 Major depressive disorder, single episode, unspecified: Secondary | ICD-10-CM | POA: Diagnosis not present

## 2023-05-27 DIAGNOSIS — K5900 Constipation, unspecified: Secondary | ICD-10-CM | POA: Diagnosis not present

## 2023-05-27 NOTE — Transitions of Care (Post Inpatient/ED Visit) (Signed)
   05/27/2023  Name: Kaitlin Phillips MRN: [id number] DOB: [birth year]  Today's TOC FU Call Status: Today's TOC FU Call Status:: Unsuccessful Call (2nd Attempt) Unsuccessful Call (1st Attempt) Date: 05/12/23 Unsuccessful Call (2nd Attempt) Date: 05/27/23  Attempted to reach the patient regarding the most recent Inpatient/ED visit.  Follow Up Plan: No further outreach attempts will be made at this time. We have been unable to contact the patient.  Signature  Karena Addison, LPN [facility] Nurse Health Advisor Direct Dial [phone]

## 2023-05-27 NOTE — Transitions of Care (Post Inpatient/ED Visit) (Signed)
   05/27/2023  Name: Kaitlin Phillips MRN: [id number] DOB: [birth year]  Today's TOC FU Call Status: Today's TOC FU Call Status:: Unsuccessful Call (3rd Attempt) Unsuccessful Call (1st Attempt) Date: 05/12/23 Unsuccessful Call (2nd Attempt) Date: 05/27/23 Unsuccessful Call (3rd Attempt) Date: 06/02/23  Attempted to reach the patient regarding the most recent Inpatient/ED visit.  Follow Up Plan: No further outreach attempts will be made at this time. We have been unable to contact the patient.  Signature Karena Addison, LPN [facility] Nurse Health Advisor Direct Dial [phone]

## 2023-05-28 DIAGNOSIS — G4089 Other seizures: Secondary | ICD-10-CM | POA: Diagnosis not present

## 2023-05-28 DIAGNOSIS — Z9102 Food additives allergy status: Secondary | ICD-10-CM | POA: Diagnosis not present

## 2023-05-28 DIAGNOSIS — F419 Anxiety disorder, unspecified: Secondary | ICD-10-CM | POA: Diagnosis not present

## 2023-05-28 DIAGNOSIS — Z8659 Personal history of other mental and behavioral disorders: Secondary | ICD-10-CM | POA: Diagnosis not present

## 2023-05-28 DIAGNOSIS — K5900 Constipation, unspecified: Secondary | ICD-10-CM | POA: Diagnosis not present

## 2023-05-28 DIAGNOSIS — R4586 Emotional lability: Secondary | ICD-10-CM | POA: Diagnosis not present

## 2023-05-28 DIAGNOSIS — R7401 Elevation of levels of liver transaminase levels: Secondary | ICD-10-CM | POA: Diagnosis not present

## 2023-05-28 DIAGNOSIS — Z87820 Personal history of traumatic brain injury: Secondary | ICD-10-CM | POA: Diagnosis not present

## 2023-05-28 DIAGNOSIS — G4700 Insomnia, unspecified: Secondary | ICD-10-CM | POA: Diagnosis not present

## 2023-05-28 DIAGNOSIS — K219 Gastro-esophageal reflux disease without esophagitis: Secondary | ICD-10-CM | POA: Diagnosis not present

## 2023-05-28 DIAGNOSIS — F329 Major depressive disorder, single episode, unspecified: Secondary | ICD-10-CM | POA: Diagnosis not present

## 2023-05-28 DIAGNOSIS — F1410 Cocaine abuse, uncomplicated: Secondary | ICD-10-CM | POA: Diagnosis not present

## 2023-05-28 DIAGNOSIS — F17200 Nicotine dependence, unspecified, uncomplicated: Secondary | ICD-10-CM | POA: Diagnosis not present

## 2023-05-28 DIAGNOSIS — F1020 Alcohol dependence, uncomplicated: Secondary | ICD-10-CM | POA: Diagnosis not present

## 2023-05-29 DIAGNOSIS — G4089 Other seizures: Secondary | ICD-10-CM | POA: Diagnosis not present

## 2023-05-29 DIAGNOSIS — R7401 Elevation of levels of liver transaminase levels: Secondary | ICD-10-CM | POA: Diagnosis not present

## 2023-05-29 DIAGNOSIS — F17200 Nicotine dependence, unspecified, uncomplicated: Secondary | ICD-10-CM | POA: Diagnosis not present

## 2023-05-29 DIAGNOSIS — F329 Major depressive disorder, single episode, unspecified: Secondary | ICD-10-CM | POA: Diagnosis not present

## 2023-05-29 DIAGNOSIS — K5900 Constipation, unspecified: Secondary | ICD-10-CM | POA: Diagnosis not present

## 2023-05-29 DIAGNOSIS — F419 Anxiety disorder, unspecified: Secondary | ICD-10-CM | POA: Diagnosis not present

## 2023-05-29 DIAGNOSIS — G4700 Insomnia, unspecified: Secondary | ICD-10-CM | POA: Diagnosis not present

## 2023-05-29 DIAGNOSIS — F1020 Alcohol dependence, uncomplicated: Secondary | ICD-10-CM | POA: Diagnosis not present

## 2023-05-29 DIAGNOSIS — Z9102 Food additives allergy status: Secondary | ICD-10-CM | POA: Diagnosis not present

## 2023-05-29 DIAGNOSIS — F1410 Cocaine abuse, uncomplicated: Secondary | ICD-10-CM | POA: Diagnosis not present

## 2023-05-29 DIAGNOSIS — R4586 Emotional lability: Secondary | ICD-10-CM | POA: Diagnosis not present

## 2023-05-29 DIAGNOSIS — K219 Gastro-esophageal reflux disease without esophagitis: Secondary | ICD-10-CM | POA: Diagnosis not present

## 2023-05-29 DIAGNOSIS — Z87820 Personal history of traumatic brain injury: Secondary | ICD-10-CM | POA: Diagnosis not present

## 2023-05-29 DIAGNOSIS — Z8659 Personal history of other mental and behavioral disorders: Secondary | ICD-10-CM | POA: Diagnosis not present

## 2023-05-30 DIAGNOSIS — G4089 Other seizures: Secondary | ICD-10-CM | POA: Diagnosis not present

## 2023-05-30 DIAGNOSIS — K5900 Constipation, unspecified: Secondary | ICD-10-CM | POA: Diagnosis not present

## 2023-05-30 DIAGNOSIS — F17200 Nicotine dependence, unspecified, uncomplicated: Secondary | ICD-10-CM | POA: Diagnosis not present

## 2023-05-30 DIAGNOSIS — F1020 Alcohol dependence, uncomplicated: Secondary | ICD-10-CM | POA: Diagnosis not present

## 2023-05-30 DIAGNOSIS — F419 Anxiety disorder, unspecified: Secondary | ICD-10-CM | POA: Diagnosis not present

## 2023-05-30 DIAGNOSIS — G4700 Insomnia, unspecified: Secondary | ICD-10-CM | POA: Diagnosis not present

## 2023-05-30 DIAGNOSIS — R4586 Emotional lability: Secondary | ICD-10-CM | POA: Diagnosis not present

## 2023-05-30 DIAGNOSIS — K219 Gastro-esophageal reflux disease without esophagitis: Secondary | ICD-10-CM | POA: Diagnosis not present

## 2023-05-30 DIAGNOSIS — F329 Major depressive disorder, single episode, unspecified: Secondary | ICD-10-CM | POA: Diagnosis not present

## 2023-05-30 DIAGNOSIS — Z9102 Food additives allergy status: Secondary | ICD-10-CM | POA: Diagnosis not present

## 2023-05-30 DIAGNOSIS — F1410 Cocaine abuse, uncomplicated: Secondary | ICD-10-CM | POA: Diagnosis not present

## 2023-05-30 DIAGNOSIS — Z8659 Personal history of other mental and behavioral disorders: Secondary | ICD-10-CM | POA: Diagnosis not present

## 2023-05-30 DIAGNOSIS — Z87820 Personal history of traumatic brain injury: Secondary | ICD-10-CM | POA: Diagnosis not present

## 2023-05-30 DIAGNOSIS — R7401 Elevation of levels of liver transaminase levels: Secondary | ICD-10-CM | POA: Diagnosis not present

## 2023-05-31 DIAGNOSIS — F329 Major depressive disorder, single episode, unspecified: Secondary | ICD-10-CM | POA: Diagnosis not present

## 2023-05-31 DIAGNOSIS — R7401 Elevation of levels of liver transaminase levels: Secondary | ICD-10-CM | POA: Diagnosis not present

## 2023-05-31 DIAGNOSIS — G4700 Insomnia, unspecified: Secondary | ICD-10-CM | POA: Diagnosis not present

## 2023-05-31 DIAGNOSIS — Z9102 Food additives allergy status: Secondary | ICD-10-CM | POA: Diagnosis not present

## 2023-05-31 DIAGNOSIS — K219 Gastro-esophageal reflux disease without esophagitis: Secondary | ICD-10-CM | POA: Diagnosis not present

## 2023-05-31 DIAGNOSIS — F1020 Alcohol dependence, uncomplicated: Secondary | ICD-10-CM | POA: Diagnosis not present

## 2023-05-31 DIAGNOSIS — Z87820 Personal history of traumatic brain injury: Secondary | ICD-10-CM | POA: Diagnosis not present

## 2023-05-31 DIAGNOSIS — F1410 Cocaine abuse, uncomplicated: Secondary | ICD-10-CM | POA: Diagnosis not present

## 2023-05-31 DIAGNOSIS — F419 Anxiety disorder, unspecified: Secondary | ICD-10-CM | POA: Diagnosis not present

## 2023-05-31 DIAGNOSIS — K5900 Constipation, unspecified: Secondary | ICD-10-CM | POA: Diagnosis not present

## 2023-05-31 DIAGNOSIS — F17200 Nicotine dependence, unspecified, uncomplicated: Secondary | ICD-10-CM | POA: Diagnosis not present

## 2023-05-31 DIAGNOSIS — R4586 Emotional lability: Secondary | ICD-10-CM | POA: Diagnosis not present

## 2023-05-31 DIAGNOSIS — G4089 Other seizures: Secondary | ICD-10-CM | POA: Diagnosis not present

## 2023-05-31 DIAGNOSIS — Z8659 Personal history of other mental and behavioral disorders: Secondary | ICD-10-CM | POA: Diagnosis not present

## 2023-06-01 DIAGNOSIS — F1020 Alcohol dependence, uncomplicated: Secondary | ICD-10-CM | POA: Diagnosis not present

## 2023-06-01 DIAGNOSIS — F1410 Cocaine abuse, uncomplicated: Secondary | ICD-10-CM | POA: Diagnosis not present

## 2023-06-01 DIAGNOSIS — F329 Major depressive disorder, single episode, unspecified: Secondary | ICD-10-CM | POA: Diagnosis not present

## 2023-06-01 DIAGNOSIS — G4700 Insomnia, unspecified: Secondary | ICD-10-CM | POA: Diagnosis not present

## 2023-06-01 DIAGNOSIS — F419 Anxiety disorder, unspecified: Secondary | ICD-10-CM | POA: Diagnosis not present

## 2023-06-01 DIAGNOSIS — Z87820 Personal history of traumatic brain injury: Secondary | ICD-10-CM | POA: Diagnosis not present

## 2023-06-01 DIAGNOSIS — K219 Gastro-esophageal reflux disease without esophagitis: Secondary | ICD-10-CM | POA: Diagnosis not present

## 2023-06-01 DIAGNOSIS — G4089 Other seizures: Secondary | ICD-10-CM | POA: Diagnosis not present

## 2023-06-01 DIAGNOSIS — R4586 Emotional lability: Secondary | ICD-10-CM | POA: Diagnosis not present

## 2023-06-01 DIAGNOSIS — K5900 Constipation, unspecified: Secondary | ICD-10-CM | POA: Diagnosis not present

## 2023-06-01 DIAGNOSIS — F17200 Nicotine dependence, unspecified, uncomplicated: Secondary | ICD-10-CM | POA: Diagnosis not present

## 2023-06-01 DIAGNOSIS — Z8659 Personal history of other mental and behavioral disorders: Secondary | ICD-10-CM | POA: Diagnosis not present

## 2023-06-01 DIAGNOSIS — Z9102 Food additives allergy status: Secondary | ICD-10-CM | POA: Diagnosis not present

## 2023-06-01 DIAGNOSIS — R7401 Elevation of levels of liver transaminase levels: Secondary | ICD-10-CM | POA: Diagnosis not present

## 2023-06-02 DIAGNOSIS — F419 Anxiety disorder, unspecified: Secondary | ICD-10-CM | POA: Diagnosis not present

## 2023-06-02 DIAGNOSIS — Z87820 Personal history of traumatic brain injury: Secondary | ICD-10-CM | POA: Diagnosis not present

## 2023-06-02 DIAGNOSIS — Z9102 Food additives allergy status: Secondary | ICD-10-CM | POA: Diagnosis not present

## 2023-06-02 DIAGNOSIS — F17200 Nicotine dependence, unspecified, uncomplicated: Secondary | ICD-10-CM | POA: Diagnosis not present

## 2023-06-02 DIAGNOSIS — R7401 Elevation of levels of liver transaminase levels: Secondary | ICD-10-CM | POA: Diagnosis not present

## 2023-06-02 DIAGNOSIS — G4700 Insomnia, unspecified: Secondary | ICD-10-CM | POA: Diagnosis not present

## 2023-06-02 DIAGNOSIS — R4586 Emotional lability: Secondary | ICD-10-CM | POA: Diagnosis not present

## 2023-06-02 DIAGNOSIS — K5900 Constipation, unspecified: Secondary | ICD-10-CM | POA: Diagnosis not present

## 2023-06-02 DIAGNOSIS — G4089 Other seizures: Secondary | ICD-10-CM | POA: Diagnosis not present

## 2023-06-02 DIAGNOSIS — F329 Major depressive disorder, single episode, unspecified: Secondary | ICD-10-CM | POA: Diagnosis not present

## 2023-06-02 DIAGNOSIS — K219 Gastro-esophageal reflux disease without esophagitis: Secondary | ICD-10-CM | POA: Diagnosis not present

## 2023-06-02 DIAGNOSIS — Z8659 Personal history of other mental and behavioral disorders: Secondary | ICD-10-CM | POA: Diagnosis not present

## 2023-06-02 DIAGNOSIS — F1020 Alcohol dependence, uncomplicated: Secondary | ICD-10-CM | POA: Diagnosis not present

## 2023-06-02 DIAGNOSIS — F1410 Cocaine abuse, uncomplicated: Secondary | ICD-10-CM | POA: Diagnosis not present

## 2023-06-02 NOTE — Transitions of Care (Post Inpatient/ED Visit) (Signed)
   06/02/2023  Name: Kaitlin Phillips MRN: [id number] DOB: [birth year]  Today's TOC FU Call Status: Today's TOC FU Call Status:: Unsuccessful Call (3rd Attempt) Unsuccessful Call (1st Attempt) Date: 05/12/23 Unsuccessful Call (2nd Attempt) Date: 05/27/23 Unsuccessful Call (3rd Attempt) Date: 06/02/23  Attempted to reach the patient regarding the most recent Inpatient/ED visit.  Follow Up Plan: No further outreach attempts will be made at this time. We have been unable to contact the patient.  Signature Karena Addison, LPN [facility] Nurse Health Advisor Direct Dial [phone]

## 2023-06-03 DIAGNOSIS — K219 Gastro-esophageal reflux disease without esophagitis: Secondary | ICD-10-CM | POA: Diagnosis not present

## 2023-06-03 DIAGNOSIS — Z87820 Personal history of traumatic brain injury: Secondary | ICD-10-CM | POA: Diagnosis not present

## 2023-06-03 DIAGNOSIS — R7401 Elevation of levels of liver transaminase levels: Secondary | ICD-10-CM | POA: Diagnosis not present

## 2023-06-03 DIAGNOSIS — F329 Major depressive disorder, single episode, unspecified: Secondary | ICD-10-CM | POA: Diagnosis not present

## 2023-06-03 DIAGNOSIS — Z9102 Food additives allergy status: Secondary | ICD-10-CM | POA: Diagnosis not present

## 2023-06-03 DIAGNOSIS — F1410 Cocaine abuse, uncomplicated: Secondary | ICD-10-CM | POA: Diagnosis not present

## 2023-06-03 DIAGNOSIS — G4700 Insomnia, unspecified: Secondary | ICD-10-CM | POA: Diagnosis not present

## 2023-06-03 DIAGNOSIS — K5900 Constipation, unspecified: Secondary | ICD-10-CM | POA: Diagnosis not present

## 2023-06-03 DIAGNOSIS — F1020 Alcohol dependence, uncomplicated: Secondary | ICD-10-CM | POA: Diagnosis not present

## 2023-06-03 DIAGNOSIS — Z8659 Personal history of other mental and behavioral disorders: Secondary | ICD-10-CM | POA: Diagnosis not present

## 2023-06-03 DIAGNOSIS — R4586 Emotional lability: Secondary | ICD-10-CM | POA: Diagnosis not present

## 2023-06-03 DIAGNOSIS — G4089 Other seizures: Secondary | ICD-10-CM | POA: Diagnosis not present

## 2023-06-03 DIAGNOSIS — F419 Anxiety disorder, unspecified: Secondary | ICD-10-CM | POA: Diagnosis not present

## 2023-06-03 DIAGNOSIS — F17200 Nicotine dependence, unspecified, uncomplicated: Secondary | ICD-10-CM | POA: Diagnosis not present

## 2023-06-04 DIAGNOSIS — F419 Anxiety disorder, unspecified: Secondary | ICD-10-CM | POA: Diagnosis not present

## 2023-06-04 DIAGNOSIS — F17200 Nicotine dependence, unspecified, uncomplicated: Secondary | ICD-10-CM | POA: Diagnosis not present

## 2023-06-04 DIAGNOSIS — R7401 Elevation of levels of liver transaminase levels: Secondary | ICD-10-CM | POA: Diagnosis not present

## 2023-06-04 DIAGNOSIS — Z87820 Personal history of traumatic brain injury: Secondary | ICD-10-CM | POA: Diagnosis not present

## 2023-06-04 DIAGNOSIS — G4700 Insomnia, unspecified: Secondary | ICD-10-CM | POA: Diagnosis not present

## 2023-06-04 DIAGNOSIS — K5900 Constipation, unspecified: Secondary | ICD-10-CM | POA: Diagnosis not present

## 2023-06-04 DIAGNOSIS — Z9102 Food additives allergy status: Secondary | ICD-10-CM | POA: Diagnosis not present

## 2023-06-04 DIAGNOSIS — F329 Major depressive disorder, single episode, unspecified: Secondary | ICD-10-CM | POA: Diagnosis not present

## 2023-06-04 DIAGNOSIS — F1020 Alcohol dependence, uncomplicated: Secondary | ICD-10-CM | POA: Diagnosis not present

## 2023-06-04 DIAGNOSIS — Z8659 Personal history of other mental and behavioral disorders: Secondary | ICD-10-CM | POA: Diagnosis not present

## 2023-06-04 DIAGNOSIS — G4089 Other seizures: Secondary | ICD-10-CM | POA: Diagnosis not present

## 2023-06-04 DIAGNOSIS — K219 Gastro-esophageal reflux disease without esophagitis: Secondary | ICD-10-CM | POA: Diagnosis not present

## 2023-06-04 DIAGNOSIS — F1410 Cocaine abuse, uncomplicated: Secondary | ICD-10-CM | POA: Diagnosis not present

## 2023-06-04 DIAGNOSIS — R4586 Emotional lability: Secondary | ICD-10-CM | POA: Diagnosis not present

## 2023-06-05 DIAGNOSIS — G4700 Insomnia, unspecified: Secondary | ICD-10-CM | POA: Diagnosis not present

## 2023-06-05 DIAGNOSIS — K5900 Constipation, unspecified: Secondary | ICD-10-CM | POA: Diagnosis not present

## 2023-06-05 DIAGNOSIS — Z8659 Personal history of other mental and behavioral disorders: Secondary | ICD-10-CM | POA: Diagnosis not present

## 2023-06-05 DIAGNOSIS — F1410 Cocaine abuse, uncomplicated: Secondary | ICD-10-CM | POA: Diagnosis not present

## 2023-06-05 DIAGNOSIS — K219 Gastro-esophageal reflux disease without esophagitis: Secondary | ICD-10-CM | POA: Diagnosis not present

## 2023-06-05 DIAGNOSIS — Z87820 Personal history of traumatic brain injury: Secondary | ICD-10-CM | POA: Diagnosis not present

## 2023-06-05 DIAGNOSIS — F17200 Nicotine dependence, unspecified, uncomplicated: Secondary | ICD-10-CM | POA: Diagnosis not present

## 2023-06-05 DIAGNOSIS — F1020 Alcohol dependence, uncomplicated: Secondary | ICD-10-CM | POA: Diagnosis not present

## 2023-06-05 DIAGNOSIS — R4586 Emotional lability: Secondary | ICD-10-CM | POA: Diagnosis not present

## 2023-06-05 DIAGNOSIS — F419 Anxiety disorder, unspecified: Secondary | ICD-10-CM | POA: Diagnosis not present

## 2023-06-05 DIAGNOSIS — R7401 Elevation of levels of liver transaminase levels: Secondary | ICD-10-CM | POA: Diagnosis not present

## 2023-06-05 DIAGNOSIS — Z9102 Food additives allergy status: Secondary | ICD-10-CM | POA: Diagnosis not present

## 2023-06-05 DIAGNOSIS — F329 Major depressive disorder, single episode, unspecified: Secondary | ICD-10-CM | POA: Diagnosis not present

## 2023-06-05 DIAGNOSIS — G4089 Other seizures: Secondary | ICD-10-CM | POA: Diagnosis not present

## 2023-06-06 DIAGNOSIS — F17200 Nicotine dependence, unspecified, uncomplicated: Secondary | ICD-10-CM | POA: Diagnosis not present

## 2023-06-06 DIAGNOSIS — F1410 Cocaine abuse, uncomplicated: Secondary | ICD-10-CM | POA: Diagnosis not present

## 2023-06-06 DIAGNOSIS — G4700 Insomnia, unspecified: Secondary | ICD-10-CM | POA: Diagnosis not present

## 2023-06-06 DIAGNOSIS — K5900 Constipation, unspecified: Secondary | ICD-10-CM | POA: Diagnosis not present

## 2023-06-06 DIAGNOSIS — Z8659 Personal history of other mental and behavioral disorders: Secondary | ICD-10-CM | POA: Diagnosis not present

## 2023-06-06 DIAGNOSIS — R4586 Emotional lability: Secondary | ICD-10-CM | POA: Diagnosis not present

## 2023-06-06 DIAGNOSIS — F419 Anxiety disorder, unspecified: Secondary | ICD-10-CM | POA: Diagnosis not present

## 2023-06-06 DIAGNOSIS — F329 Major depressive disorder, single episode, unspecified: Secondary | ICD-10-CM | POA: Diagnosis not present

## 2023-06-06 DIAGNOSIS — Z87820 Personal history of traumatic brain injury: Secondary | ICD-10-CM | POA: Diagnosis not present

## 2023-06-06 DIAGNOSIS — Z9102 Food additives allergy status: Secondary | ICD-10-CM | POA: Diagnosis not present

## 2023-06-06 DIAGNOSIS — K219 Gastro-esophageal reflux disease without esophagitis: Secondary | ICD-10-CM | POA: Diagnosis not present

## 2023-06-06 DIAGNOSIS — R7401 Elevation of levels of liver transaminase levels: Secondary | ICD-10-CM | POA: Diagnosis not present

## 2023-06-06 DIAGNOSIS — G4089 Other seizures: Secondary | ICD-10-CM | POA: Diagnosis not present

## 2023-06-06 DIAGNOSIS — F1020 Alcohol dependence, uncomplicated: Secondary | ICD-10-CM | POA: Diagnosis not present

## 2023-06-18 ENCOUNTER — Ambulatory Visit: Payer: BC Managed Care – PPO | Admitting: Family Medicine

## 2023-06-18 ENCOUNTER — Telehealth: Payer: Self-pay | Admitting: Family Medicine

## 2023-06-18 ENCOUNTER — Encounter: Payer: Self-pay | Admitting: Family Medicine

## 2023-06-18 ENCOUNTER — Encounter: Payer: Self-pay | Admitting: *Deleted

## 2023-06-18 VITALS — BP 116/87 | HR 73 | Ht 65.0 in | Wt 136.0 lb

## 2023-06-18 DIAGNOSIS — F17200 Nicotine dependence, unspecified, uncomplicated: Secondary | ICD-10-CM

## 2023-06-18 DIAGNOSIS — F32A Depression, unspecified: Secondary | ICD-10-CM

## 2023-06-18 DIAGNOSIS — F419 Anxiety disorder, unspecified: Secondary | ICD-10-CM | POA: Diagnosis not present

## 2023-06-18 DIAGNOSIS — F1411 Cocaine abuse, in remission: Secondary | ICD-10-CM | POA: Diagnosis not present

## 2023-06-18 DIAGNOSIS — F1021 Alcohol dependence, in remission: Secondary | ICD-10-CM | POA: Insufficient documentation

## 2023-06-18 MED ORDER — SERTRALINE HCL 50 MG PO TABS: 150.0000 mg | ORAL_TABLET | Freq: Every day | ORAL | 3 refills | Status: DC

## 2023-06-18 MED ORDER — QUETIAPINE FUMARATE 50 MG PO TABS: 50.0000 mg | ORAL_TABLET | Freq: Every day | ORAL | 3 refills | Status: DC

## 2023-06-18 MED ORDER — NALTREXONE HCL 50 MG PO TABS: 50.0000 mg | ORAL_TABLET | Freq: Every day | ORAL | 3 refills | Status: DC

## 2023-06-18 MED ORDER — BUSPIRONE HCL 15 MG PO TABS: 15.0000 mg | ORAL_TABLET | Freq: Three times a day (TID) | ORAL | 3 refills | Status: DC

## 2023-06-18 NOTE — Assessment & Plan Note (Signed)
Chronic; unchanged Continue to monitor use with no recent use of EtOH or cocaine Remains pre contemplative regarding cessation efforts at is time

## 2023-06-18 NOTE — Progress Notes (Signed)
Established patient visit   Patient: Kaitlin Phillips   DOB: 1986/01/18   37 y.o. Female  MRN: [id number] Visit Date: 06/18/2023  Today's healthcare provider: Jacky Kindle, FNP  Introduced to nurse practitioner role and practice setting.  All questions answered.  Discussed provider/patient relationship and expectations.   Chief Complaint  Patient presents with   Follow-up   Subjective    HPI HPI   Pt stated--Dr. Lysbeth Penner recommended--needed medication for craving for drugs and alcohol. Last edited by Shelly Bombard, CMA on 06/18/2023 11:06 AM.      Medications: Outpatient Medications Prior to Visit  Medication Sig   levonorgestrel (MIRENA, 52 MG,) 20 MCG/DAY IUD 1 each by Intrauterine route once.   QUEtiapine (SEROQUEL) 50 MG tablet Take 50 mg by mouth at bedtime as needed.   [DISCONTINUED] busPIRone (BUSPAR) 15 MG tablet Take 15 mg by mouth 3 (three) times daily.   [DISCONTINUED] sertraline (ZOLOFT) 100 MG tablet Take 100 mg by mouth every morning.   levonorgestrel (MIRENA) 20 MCG/24HR IUD 1 Intra Uterine Device (1 each total) by Intrauterine route once for 1 dose.   [DISCONTINUED] Cyanocobalamin (VITAMIN B-12 PO) Take by mouth.   [DISCONTINUED] hydrOXYzine (VISTARIL) 25 MG capsule Take 1 capsule (25 mg total) by mouth 3 (three) times daily.   No facility-administered medications prior to visit.    Review of Systems    Objective    BP 116/87   Pulse 73   Ht 5\' 5"  (1.651 m)   Wt 136 lb (61.7 kg)   SpO2 100%   BMI 22.63 kg/m   Physical Exam Vitals and nursing note reviewed.  Constitutional:      General: She is not in acute distress.    Appearance: Normal appearance. She is normal weight. She is not ill-appearing, toxic-appearing or diaphoretic.  HENT:     Head: Normocephalic and atraumatic.  Cardiovascular:     Rate and Rhythm: Normal rate and regular rhythm.     Pulses: Normal pulses.     Heart sounds: Normal heart sounds. No murmur heard.    No friction  rub. No gallop.  Pulmonary:     Effort: Pulmonary effort is normal. No respiratory distress.     Breath sounds: Normal breath sounds. No stridor. No wheezing, rhonchi or rales.  Chest:     Chest wall: No tenderness.  Abdominal:     General: Bowel sounds are normal.     Palpations: Abdomen is soft.  Musculoskeletal:        General: No swelling, tenderness, deformity or signs of injury. Normal range of motion.     Right lower leg: No edema.     Left lower leg: No edema.  Skin:    General: Skin is warm and dry.     Capillary Refill: Capillary refill takes less than 2 seconds.     Coloration: Skin is not jaundiced or pale.     Findings: No bruising, erythema, lesion or rash.  Neurological:     General: No focal deficit present.     Mental Status: She is alert and oriented to person, place, and time. Mental status is at baseline.     Cranial Nerves: No cranial nerve deficit.     Sensory: No sensory deficit.     Motor: No weakness.     Coordination: Coordination normal.  Psychiatric:        Mood and Affect: Mood normal.        Behavior: Behavior  normal.        Thought Content: Thought content normal.        Judgment: Judgment normal.      No results found for any visits on 06/18/23.  Assessment & Plan     Problem List Items Addressed This Visit       Other   Alcohol use disorder, severe, in early remission (HCC)    Hx of hospital stay and 30 day [facility] stay Congratulated       Anxiety and depression    Chronic; stable Notes some increased anxiety since leaving rehab Will increase zoloft to 150 today; repeat OV in 2 months  Continue buspar at previous dose as well as seroquel Add naltrexone for cravings       Relevant Medications   sertraline (ZOLOFT) 50 MG tablet   busPIRone (BUSPAR) 15 MG tablet   Cocaine use disorder, mild, in early remission (HCC) - Primary    Hx of hospital stay and 30 day [facility] stay Congratulated       Tobacco use  disorder    Chronic; unchanged Continue to monitor use with no recent use of EtOH or cocaine Remains pre contemplative regarding cessation efforts at is time       Return in about 2 months (around 08/19/2023) for chonic disease management.     Leilani Merl, FNP, have reviewed all documentation for this visit. The documentation on 06/18/23 for the exam, diagnosis, procedures, and orders are all accurate and complete.  Jacky Kindle, FNP  [facility] [phone] (phone) [phone] (fax)  [facility]

## 2023-06-18 NOTE — Assessment & Plan Note (Signed)
Hx of hospital stay and 30 day [facility]

## 2023-06-18 NOTE — Telephone Encounter (Signed)
Kathlene November with [facility] states that he received an electronic prescription for the pt for sertraline (ZOLOFT) 50 MG tablet   To take 3 times a day. Pt insurance will not cover this. Pt insurance will cover 100mg  tablet to take 1 1/2 a day.   Kathlene November is wanting to see if a new prescription can be sent in for the pt.

## 2023-06-18 NOTE — Assessment & Plan Note (Signed)
Hx of hospital stay and 30 day [facility] stay Congratulated  

## 2023-06-18 NOTE — Assessment & Plan Note (Addendum)
Chronic; stable Notes some increased anxiety since leaving rehab Will increase zoloft to 150 today; repeat OV in 2 months  Continue buspar at previous dose as well as seroquel Add naltrexone for cravings

## 2023-06-19 ENCOUNTER — Other Ambulatory Visit: Payer: Self-pay | Admitting: Family Medicine

## 2023-06-19 MED ORDER — SERTRALINE HCL 100 MG PO TABS: 150.0000 mg | ORAL_TABLET | Freq: Every day | ORAL | 3 refills | Status: DC

## 2023-07-20 ENCOUNTER — Emergency Department
Admission: EM | Admit: 2023-07-20 | Discharge: 2023-07-20 | Disposition: A | Discharge status: Home / Self Care | Payer: BC Managed Care – PPO | Attending: Emergency Medicine | Admitting: Emergency Medicine

## 2023-07-20 ENCOUNTER — Ambulatory Visit: Payer: Self-pay

## 2023-07-20 ENCOUNTER — Other Ambulatory Visit: Payer: Self-pay

## 2023-07-20 DIAGNOSIS — R569 Unspecified convulsions: Secondary | ICD-10-CM | POA: Diagnosis not present

## 2023-07-20 DIAGNOSIS — E876 Hypokalemia: Secondary | ICD-10-CM | POA: Insufficient documentation

## 2023-07-20 DIAGNOSIS — R9431 Abnormal electrocardiogram [ECG] [EKG]: Secondary | ICD-10-CM | POA: Diagnosis not present

## 2023-07-20 DIAGNOSIS — R251 Tremor, unspecified: Secondary | ICD-10-CM

## 2023-07-20 DIAGNOSIS — R000 Tachycardia, unspecified: Secondary | ICD-10-CM | POA: Diagnosis not present

## 2023-07-20 DIAGNOSIS — I10 Essential (primary) hypertension: Secondary | ICD-10-CM | POA: Diagnosis not present

## 2023-07-20 LAB — COMPREHENSIVE METABOLIC PANEL
ALT: 25 U/L (ref 0–44)
AST: 47 U/L — ABNORMAL HIGH (ref 15–41)
Albumin: 4.5 g/dL (ref 3.5–5.0)
Alkaline Phosphatase: 49 U/L (ref 38–126)
Anion gap: 9 (ref 5–15)
BUN: 7 mg/dL (ref 6–20)
CO2: 23 mmol/L (ref 22–32)
Calcium: 9.2 mg/dL (ref 8.9–10.3)
Chloride: 108 mmol/L (ref 98–111)
Creatinine, Ser: 0.64 mg/dL (ref 0.44–1.00)
GFR, Estimated: >60 mL/min (ref >60)
Glucose, Bld: 122 mg/dL — ABNORMAL HIGH (ref 70–99)
Potassium: 3.3 mmol/L — ABNORMAL LOW (ref 3.5–5.1)
Sodium: 140 mmol/L (ref 135–145)
Total Bilirubin: 1.3 mg/dL — ABNORMAL HIGH (ref 0.3–1.2)
Total Protein: 7.6 g/dL (ref 6.5–8.1)

## 2023-07-20 LAB — CBC
HCT: 42.7 % (ref 36.0–46.0)
Hemoglobin: 14.8 g/dL (ref 12.0–15.0)
MCH: 34.7 pg — ABNORMAL HIGH (ref 26.0–34.0)
MCHC: 34.7 g/dL (ref 30.0–36.0)
MCV: 100 fL (ref 80.0–100.0)
Platelets: 147 10*3/uL — ABNORMAL LOW (ref 150–400)
RBC: 4.27 MIL/uL (ref 3.87–5.11)
RDW: 12.6 % (ref 11.5–15.5)
WBC: 4.8 10*3/uL (ref 4.0–10.5)
nRBC: 0 % (ref 0.0–0.2)

## 2023-07-20 MED ORDER — GABAPENTIN 300 MG PO CAPS: 300.0000 mg | ORAL_CAPSULE | Freq: Three times a day (TID) | ORAL | 11 refills | Status: DC

## 2023-07-20 NOTE — Telephone Encounter (Signed)
  Chief Complaint: Jerky movements  - Feels like passing out - dizziness Symptoms: above Frequency: yesterday Pertinent Negatives: Patient denies  Disposition: [x] ED /[] Urgent Care (no appt availability in office) / [] Appointment(In office/virtual)/ []  [facility]/ [] Home Care/ [] Refused Recommended Disposition /[] [place] [facility] [facility]/ []  Follow-up with PCP Additional Notes: Pt states that yesterday she was having muscle jerks while driving. Jerks continue to day. Pt also states she is dizzy and feels like passing out.  Pt thinks it may be a medication reaction, but is unsure. Pt will go to ED.  She may call ems.     Reason for Disposition  [1] New-onset muscle jerks AND [2] episode lasts > 1 minute AND [3] resolved  Answer Assessment - Initial Assessment Questions 1. APPEARANCE of MOVEMENT: "What did the jerking or twitching look like?" (e.g., body area)     Shaking 2. ONSET: "When did this start happening?" (e.g., hours, days, weeks, months ago)     Yesterday 3. DURATION: "How long does the jerk, twitch, or spasm last?"     All day 4. FREQUENCY:  "How often does this happen?"      Has seizure disorder. 5. WHEN: "When does this happen?" (e.g., while awake, while falling asleep, while sleeping)     Yesterday - could not sleep last night 6. CAUSE: "What do you think caused the ?"     Medication? 7. OTHER SYMPTOMS: "Are there any other symptoms?" (e.g., fever, headache)     Dizziness - feels like passing out.  Protocols used: Muscle Jerks - Tics - [facility]

## 2023-07-20 NOTE — ED Provider Notes (Signed)
[facility] Provider Note   Event Date/Time   First MD Initiated Contact with Patient 07/20/23 1714     (approximate) History  Tremors  HPI Kaitlin Phillips is a 37 y.o. female with a stated past medical history of polysubstance abuse who is presenting approximately 1 week after alcohol detoxification complaining of intermittent tremors.  Patient is concerned as she states she has been taken off of her gabapentin during her inpatient stay and detoxification center and is concerned that this may be causing her increasing tremors.  Patient states that the symptoms including the tremors, lightheadedness, and chest pain occurred previously before she had seizure activity.  Patient states that she was taking gabapentin for seizure activity and does not know why she was taken off.  Patient denies any full loss of consciousness or bowel/bladder incontinence, tongue biting, or any other intraoral trauma during these tremulous spells.  Patient states that he come out for approximately 30 seconds and resolved spontaneously.  Patient denies any continued alcohol or other substance abuse ROS: Patient currently denies any vision changes, tinnitus, difficulty speaking, facial droop, sore throat, shortness of breath, abdominal pain, nausea/vomiting/diarrhea, dysuria, or weakness/numbness/paresthesias in any extremity   Physical Exam  Triage Vital Signs: ED Triage Vitals  Encounter Vitals Group     BP 07/20/23 1524 (!) 145/104     Systolic BP Percentile --      Diastolic BP Percentile --      Pulse Rate 07/20/23 1531 88     Resp 07/20/23 1524 17     Temp 07/20/23 1524 98.8 F (37.1 C)     Temp Source 07/20/23 1524 Oral     SpO2 07/20/23 1524 97 %     Weight 07/20/23 1529 134 lb (60.8 kg)     Height 07/20/23 1529 5\' 5"  (1.651 m)     Head Circumference --      Peak Flow --      Pain Score 07/20/23 1529 6     Pain Loc --      Pain Education --      Exclude from Growth Chart --     Most recent vital signs: Vitals:   07/20/23 1800 07/20/23 1830  BP: (!) 140/99 (!) 150/104  Pulse: 93 94  Resp:    Temp:    SpO2: 100% 98%   General: Awake, oriented x4. CV:  Good peripheral perfusion.  Resp:  Normal effort.  Abd:  No distention.  Other:  Middle-aged well-developed, well-nourished Caucasian female laying in bed in no acute distress.  No tremors appreciated.  NIHSS 0 ED Results / Procedures / Treatments  Labs (all labs ordered are listed, but only abnormal results are displayed) Labs Reviewed  CBC - Abnormal; Notable for the following components:      Result Value   MCH 34.7 (*)    Platelets 147 (*)    All other components within normal limits  COMPREHENSIVE METABOLIC PANEL - Abnormal; Notable for the following components:   Potassium 3.3 (*)    Glucose, Bld 122 (*)    AST 47 (*)    Total Bilirubin 1.3 (*)    All other components within normal limits  PROCEDURES: Critical Care performed: No .1-3 Lead EKG Interpretation  Performed by: Merwyn Katos, MD Authorized by: Merwyn Katos, MD     Interpretation: normal     ECG rate:  71   ECG rate assessment: normal     Rhythm: sinus rhythm  Ectopy: none     Conduction: normal    MEDICATIONS ORDERED IN ED: Medications - No data to display IMPRESSION / MDM / ASSESSMENT AND PLAN / ED COURSE  I reviewed the triage vital signs and the nursing notes.              The patient is on the cardiac monitor to evaluate for evidence of arrhythmia and/or significant heart rate changes. Patient's presentation is most consistent with acute presentation with potential threat to life or bodily function. Patient 37 year old female with the above-stated past medical history who presents complaining of intermittent tremors after leaving rehab stopping her gabapentin dosing.  Patient denies any continued alcohol or any other illicit substance abuse at this time.  Patient's laboratory evaluation does not show any evidence  of acute abnormalities.  Patient has mild hypokalemia and was encouraged to follow-up with her primary care physician for further management.  Will restart patient's gabapentin at low-dose and attempt to control these tremors as well as follow-up with neurology.  Patient agrees with plan for discharge home and follow-up with neurology  Dispo: Discharge home   FINAL CLINICAL IMPRESSION(S) / ED DIAGNOSES   Final diagnoses:  Tremors of nervous system   Rx / DC Orders   ED Discharge Orders          Ordered    gabapentin (NEURONTIN) 300 MG capsule  3 times daily        07/20/23 1750           Note:  This document was prepared using Dragon voice recognition software and may include unintentional dictation errors.   Merwyn Katos, MD 07/21/23 1524

## 2023-07-20 NOTE — ED Notes (Signed)
First Nurse Note: Patient to ED via [facility] from home for seizures. Pt states she has been shaking since yesterday. Pt state she has also been clean from alcohol x2 month. 20 L forearm  EMS VS: 149/95 99% RA 85 HR 127 cbg

## 2023-07-20 NOTE — ED Triage Notes (Addendum)
Pt sts that she was a alcohol and drug detox. Pt sts that they placed her medication for the detox. Pt has been having shaking and tremors since than with N/V. Pt sts that she does have a hx of seizures. Pt sts that she has been taking gabapentin for her seizures but since she left the detox center she has stopped taking the meds.

## 2023-08-19 ENCOUNTER — Ambulatory Visit: Payer: BC Managed Care – PPO | Admitting: Family Medicine

## 2023-08-19 ENCOUNTER — Encounter: Payer: Self-pay | Admitting: Family Medicine

## 2023-08-19 VITALS — BP 109/73 | HR 78 | Wt 142.5 lb

## 2023-08-19 DIAGNOSIS — F39 Unspecified mood [affective] disorder: Secondary | ICD-10-CM

## 2023-08-19 DIAGNOSIS — E876 Hypokalemia: Secondary | ICD-10-CM | POA: Diagnosis not present

## 2023-08-19 DIAGNOSIS — R251 Tremor, unspecified: Secondary | ICD-10-CM | POA: Diagnosis not present

## 2023-08-19 DIAGNOSIS — R739 Hyperglycemia, unspecified: Secondary | ICD-10-CM

## 2023-08-19 DIAGNOSIS — F17200 Nicotine dependence, unspecified, uncomplicated: Secondary | ICD-10-CM | POA: Diagnosis not present

## 2023-08-19 DIAGNOSIS — F1020 Alcohol dependence, uncomplicated: Secondary | ICD-10-CM | POA: Diagnosis not present

## 2023-08-19 DIAGNOSIS — Z87898 Personal history of other specified conditions: Secondary | ICD-10-CM | POA: Diagnosis not present

## 2023-08-19 DIAGNOSIS — F1411 Cocaine abuse, in remission: Secondary | ICD-10-CM

## 2023-08-19 MED ORDER — QUETIAPINE FUMARATE 50 MG PO TABS: 50.0000 mg | ORAL_TABLET | Freq: Every day | ORAL | 3 refills | Status: DC

## 2023-08-19 MED ORDER — NALTREXONE HCL 50 MG PO TABS: 50.0000 mg | ORAL_TABLET | Freq: Every day | ORAL | 3 refills | Status: DC

## 2023-08-19 MED ORDER — GABAPENTIN 300 MG PO CAPS: 300.0000 mg | ORAL_CAPSULE | Freq: Three times a day (TID) | ORAL | 3 refills | Status: DC

## 2023-08-19 NOTE — Assessment & Plan Note (Signed)
Chronic, worsening given family stressors Continue buspar 15 mg TID Continue seroquel 50 mg at bedtime Continue daily zoloft 150 mg  Add naltrexone 50 mg daily

## 2023-08-19 NOTE — Assessment & Plan Note (Signed)
Recommend A1c Continue to recommend balanced, lower carb meals. Smaller meal size, adding snacks. Choosing water as drink of choice and increasing purposeful exercise.  

## 2023-08-19 NOTE — Assessment & Plan Note (Signed)
Chronic, historically Remains sober; congratulated Continue medication to assist

## 2023-08-19 NOTE — Assessment & Plan Note (Signed)
Patient has started drinking again; reports failure to obtain naltrexone given cost. Will rewrite again with use of goodrx. Encouraged to follow up as needed; recommend cessation given concerns of previous addiction

## 2023-08-19 NOTE — Progress Notes (Signed)
Established patient visit   Patient: Kaitlin Phillips   DOB: Jun 28, 1986   37 y.o. Female  MRN: [id number] Visit Date: 08/19/2023  Today's healthcare provider: Jacky Kindle, FNP  Introduced to nurse practitioner role and practice setting.  All questions answered.  Discussed provider/patient relationship and expectations.  Subjective    HPI HPI     Medical Management of Chronic Issues    Additional comments: 2 month anxiety/depression follow-up. Patient last seen on 06/18/23 and was to increase sertraline to 150 mg.      Last edited by Acey Lav, CMA on 08/19/2023 10:11 AM.         08/19/2023   10:19 AM 06/18/2023   11:10 AM 08/01/2021   11:05 AM  PHQ9 SCORE ONLY  PHQ-9 Total Score 9 5 7       08/19/2023   10:19 AM 06/18/2023   11:12 AM  GAD 7 : Generalized Anxiety Score  Nervous, Anxious, on Edge 2 3  Control/stop worrying 2 3  Worry too much - different things 2 3  Trouble relaxing 2 0  Restless 2 1  Easily annoyed or irritable 2 1  Afraid - awful might happen 1 0  Total GAD 7 Score 13 11  Anxiety Difficulty Somewhat difficult Not difficult at all      Medications: Outpatient Medications Prior to Visit  Medication Sig   busPIRone (BUSPAR) 15 MG tablet Take 1 tablet (15 mg total) by mouth 3 (three) times daily.   levonorgestrel (MIRENA, 52 MG,) 20 MCG/DAY IUD 1 each by Intrauterine route once.   sertraline (ZOLOFT) 100 MG tablet Take 1.5 tablets (150 mg total) by mouth daily.   [DISCONTINUED] gabapentin (NEURONTIN) 300 MG capsule Take 1 capsule (300 mg total) by mouth 3 (three) times daily.   [DISCONTINUED] naltrexone (DEPADE) 50 MG tablet Take 1 tablet (50 mg total) by mouth daily.   [DISCONTINUED] QUEtiapine (SEROQUEL) 50 MG tablet Take 1 tablet (50 mg total) by mouth at bedtime.   levonorgestrel (MIRENA) 20 MCG/24HR IUD 1 Intra Uterine Device (1 each total) by Intrauterine route once for 1 dose.   [DISCONTINUED] QUEtiapine (SEROQUEL) 50 MG tablet Take 50 mg  by mouth at bedtime as needed.   No facility-administered medications prior to visit.    Review of Systems      Objective    BP 109/73 (BP Location: Right Arm, Patient Position: Sitting, Cuff Size: Normal)   Pulse 78   Wt 142 lb 8 oz (64.6 kg)   SpO2 98%   BMI 23.71 kg/m     Physical Exam Vitals and nursing note reviewed.  Constitutional:      General: She is not in acute distress.    Appearance: Normal appearance. She is normal weight. She is not ill-appearing, toxic-appearing or diaphoretic.  HENT:     Head: Normocephalic and atraumatic.  Cardiovascular:     Rate and Rhythm: Normal rate and regular rhythm.     Pulses: Normal pulses.     Heart sounds: Normal heart sounds. No murmur heard.    No friction rub. No gallop.  Pulmonary:     Effort: Pulmonary effort is normal. No respiratory distress.     Breath sounds: Normal breath sounds. No stridor. No wheezing, rhonchi or rales.  Chest:     Chest wall: No tenderness.  Musculoskeletal:        General: No swelling, tenderness, deformity or signs of injury. Normal range of motion.     Right lower leg:  No edema.     Left lower leg: No edema.  Skin:    General: Skin is warm and dry.     Capillary Refill: Capillary refill takes less than 2 seconds.     Coloration: Skin is not jaundiced or pale.     Findings: No bruising, erythema, lesion or rash.  Neurological:     General: No focal deficit present.     Mental Status: She is alert and oriented to person, place, and time. Mental status is at baseline.     Cranial Nerves: No cranial nerve deficit.     Sensory: No sensory deficit.     Motor: No weakness.     Coordination: Coordination normal.  Psychiatric:        Mood and Affect: Mood is anxious and depressed.        Behavior: Behavior normal.        Thought Content: Thought content normal. Thought content does not include homicidal or suicidal ideation. Thought content does not include homicidal or suicidal plan.         Judgment: Judgment normal.     No results found for any visits on 08/19/23.  Assessment & Plan     Problem List Items Addressed This Visit       Other   Alcohol use disorder, severe, dependence (HCC)    Patient has started drinking again; reports failure to obtain naltrexone given cost. Will rewrite again with use of goodrx. Encouraged to follow up as needed; recommend cessation given concerns of previous addiction      Relevant Medications   gabapentin (NEURONTIN) 300 MG capsule   naltrexone (DEPADE) 50 MG tablet   QUEtiapine (SEROQUEL) 50 MG tablet   Other Relevant Orders   HIV antibody (with reflex)   Hepatitis C Antibody   Comprehensive Metabolic Panel (CMET)   Hemoglobin A1c   CBC with Differential/Platelet   TSH   Vitamin B1   Vitamin B6   B12 and Folate Panel   Vitamin D (25 hydroxy)   Cocaine use disorder, mild, in early remission (HCC)    Chronic, historically Remains sober; congratulated Continue medication to assist       Relevant Medications   gabapentin (NEURONTIN) 300 MG capsule   naltrexone (DEPADE) 50 MG tablet   QUEtiapine (SEROQUEL) 50 MG tablet   Other Relevant Orders   HIV antibody (with reflex)   Hepatitis C Antibody   Comprehensive Metabolic Panel (CMET)   Hemoglobin A1c   CBC with Differential/Platelet   TSH   Vitamin B1   Vitamin B6   B12 and Folate Panel   Vitamin D (25 hydroxy)   Elevated serum glucose    Recommend A1c Continue to recommend balanced, lower carb meals. Smaller meal size, adding snacks. Choosing water as drink of choice and increasing purposeful exercise.       Relevant Orders   Hemoglobin A1c   Mood disorder (HCC) - Primary    Chronic, worsening given family stressors Continue buspar 15 mg TID Continue seroquel 50 mg at bedtime Continue daily zoloft 150 mg  Add naltrexone 50 mg daily      Relevant Medications   gabapentin (NEURONTIN) 300 MG capsule   naltrexone (DEPADE) 50 MG tablet   QUEtiapine  (SEROQUEL) 50 MG tablet   Other Relevant Orders   HIV antibody (with reflex)   Hepatitis C Antibody   Comprehensive Metabolic Panel (CMET)   Hemoglobin A1c   CBC with Differential/Platelet   TSH   Vitamin B1  Vitamin B6   B12 and Folate Panel   Vitamin D (25 hydroxy)   Tobacco use disorder    Chronic, stable Remains pre-contemplative at this time iso cessation efforts       Return in about 4 weeks (around 09/16/2023) for anxiety and depression.     Leilani Merl, FNP, have reviewed all documentation for this visit. The documentation on 08/19/23 for the exam, diagnosis, procedures, and orders are all accurate and complete.  Jacky Kindle, FNP  [facility] [phone] (phone) [phone] (fax)  [facility]

## 2023-08-19 NOTE — Assessment & Plan Note (Signed)
Chronic, stable Remains pre-contemplative at this time iso cessation efforts

## 2023-08-20 NOTE — Progress Notes (Signed)
Call; not on [facility].  Recommend Vit D supplement at 5000 IU Vit D 3 daily with meal x6 months. Repeat labs following completion.  All other labs that have returned are normal and stable; two vitamin panels pending

## 2023-08-24 LAB — VITAMIN D 25 HYDROXY (VIT D DEFICIENCY, FRACTURES): Vit D, 25-Hydroxy: 24 ng/mL — ABNORMAL LOW (ref 30.0–100.0)

## 2023-08-24 LAB — CBC WITH DIFFERENTIAL/PLATELET
Basophils Absolute: 0 10*3/uL (ref 0.0–0.2)
Basos: 1 %
EOS (ABSOLUTE): 0.1 10*3/uL (ref 0.0–0.4)
Eos: 1 %
Hematocrit: 43.3 % (ref 34.0–46.6)
Hemoglobin: 14.8 g/dL (ref 11.1–15.9)
Immature Grans (Abs): 0 10*3/uL (ref 0.0–0.1)
Immature Granulocytes: 0 %
Lymphocytes Absolute: 1.5 10*3/uL (ref 0.7–3.1)
Lymphs: 25 %
MCH: 34.6 pg — ABNORMAL HIGH (ref 26.6–33.0)
MCHC: 34.2 g/dL (ref 31.5–35.7)
MCV: 101 fL — ABNORMAL HIGH (ref 79–97)
Monocytes Absolute: 0.4 10*3/uL (ref 0.1–0.9)
Monocytes: 7 %
Neutrophils Absolute: 4 10*3/uL (ref 1.4–7.0)
Neutrophils: 66 %
Platelets: 159 10*3/uL (ref 150–450)
RBC: 4.28 x10E6/uL (ref 3.77–5.28)
RDW: 11.8 % (ref 11.7–15.4)
WBC: 6 10*3/uL (ref 3.4–10.8)

## 2023-08-24 LAB — HEMOGLOBIN A1C
Est. average glucose Bld gHb Est-mCnc: 94 mg/dL
Hgb A1c MFr Bld: 4.9 % (ref 4.8–5.6)

## 2023-08-24 LAB — COMPREHENSIVE METABOLIC PANEL
ALT: 15 IU/L (ref 0–32)
AST: 22 IU/L (ref 0–40)
Albumin: 4.4 g/dL (ref 3.9–4.9)
Alkaline Phosphatase: 49 IU/L (ref 44–121)
BUN/Creatinine Ratio: 23 (ref 9–23)
BUN: 15 mg/dL (ref 6–20)
Bilirubin Total: 0.3 mg/dL (ref 0.0–1.2)
CO2: 21 mmol/L (ref 20–29)
Calcium: 9.5 mg/dL (ref 8.7–10.2)
Chloride: 103 mmol/L (ref 96–106)
Creatinine, Ser: 0.64 mg/dL (ref 0.57–1.00)
Globulin, Total: 2.1 g/dL (ref 1.5–4.5)
Glucose: 79 mg/dL (ref 70–99)
Potassium: 4.1 mmol/L (ref 3.5–5.2)
Sodium: 138 mmol/L (ref 134–144)
Total Protein: 6.5 g/dL (ref 6.0–8.5)
eGFR: 116 mL/min/{1.73_m2} (ref >59)

## 2023-08-24 LAB — B12 AND FOLATE PANEL
Folate: 6.3 ng/mL (ref >3.0)
Vitamin B-12: 664 pg/mL (ref 232–1245)

## 2023-08-24 LAB — HIV ANTIBODY (ROUTINE TESTING W REFLEX): HIV Screen 4th Generation wRfx: NONREACTIVE

## 2023-08-24 LAB — HEPATITIS C ANTIBODY: Hep C Virus Ab: NONREACTIVE

## 2023-08-24 LAB — VITAMIN B6: Vitamin B6: 10.1 ug/L (ref 3.4–65.2)

## 2023-08-24 LAB — VITAMIN B1: Thiamine: 131.1 nmol/L (ref 66.5–200.0)

## 2023-08-24 LAB — TSH: TSH: 1.89 u[IU]/mL (ref 0.450–4.500)

## 2023-08-24 NOTE — Progress Notes (Signed)
All B vitamins stable

## 2023-09-17 ENCOUNTER — Ambulatory Visit (INDEPENDENT_AMBULATORY_CARE_PROVIDER_SITE_OTHER): Payer: BC Managed Care – PPO | Admitting: Family Medicine

## 2023-09-17 DIAGNOSIS — Z91199 Patient's noncompliance with other medical treatment and regimen due to unspecified reason: Secondary | ICD-10-CM

## 2023-09-17 NOTE — Progress Notes (Signed)
Patient was not seen for appt d/t no call, no show, or late arrival >10 mins past appt time.   Elise T Payne, FNP  [facility] [address] [place], [place] [address] [phone] (phone) [phone] (fax) [facility]  

## 2023-10-20 ENCOUNTER — Encounter: Payer: Self-pay | Admitting: Family Medicine

## 2023-10-20 ENCOUNTER — Ambulatory Visit (INDEPENDENT_AMBULATORY_CARE_PROVIDER_SITE_OTHER): Payer: BC Managed Care – PPO | Admitting: Family Medicine

## 2023-10-20 VITALS — BP 125/80 | HR 94 | Ht 65.0 in | Wt 153.0 lb

## 2023-10-20 DIAGNOSIS — F5102 Adjustment insomnia: Secondary | ICD-10-CM

## 2023-10-20 DIAGNOSIS — F39 Unspecified mood [affective] disorder: Secondary | ICD-10-CM | POA: Diagnosis not present

## 2023-10-20 DIAGNOSIS — F1411 Cocaine abuse, in remission: Secondary | ICD-10-CM

## 2023-10-20 DIAGNOSIS — F1020 Alcohol dependence, uncomplicated: Secondary | ICD-10-CM | POA: Diagnosis not present

## 2023-10-20 MED ORDER — ZOLPIDEM TARTRATE 5 MG PO TABS: ORAL_TABLET | ORAL | 0 refills | Status: DC

## 2023-10-20 NOTE — Assessment & Plan Note (Signed)
Chronic, variable use Continue to recommend cessation efforts given hx of ongoing concerns

## 2023-10-20 NOTE — Assessment & Plan Note (Signed)
Chronic, stable at this time Continue to monitor Congratulated on current remission

## 2023-10-20 NOTE — Assessment & Plan Note (Signed)
Chronic, worsening Reports OCD is "out of control" and reports increase in irritability on relationship concerns Wishes to adjust medications to assist Recommend 1 step at a time to monitor for benefit vs lack there of

## 2023-10-20 NOTE — Progress Notes (Signed)
Established patient visit   Patient: Kaitlin Phillips   DOB: Oct 25, 1986   37 y.o. Female  MRN: [id number] Visit Date: 10/20/2023  Today's healthcare provider: Jacky Kindle, FNP  Re Introduced to nurse practitioner role and practice setting.  All questions answered.  Discussed provider/patient relationship and expectations.  Chief Complaint  Patient presents with   Weight Management Screening    Weight gain and bloating. Pt stated--mood is getting worse and easily aggravate    Subjective    HPI HPI     Weight Management Screening    Additional comments: Weight gain and bloating. Pt stated--mood is getting worse and easily aggravate       Last edited by Shelly Bombard, CMA on 10/20/2023  1:26 PM.      Medications: Outpatient Medications Prior to Visit  Medication Sig   busPIRone (BUSPAR) 15 MG tablet Take 1 tablet (15 mg total) by mouth 3 (three) times daily.   gabapentin (NEURONTIN) 300 MG capsule Take 1 capsule (300 mg total) by mouth 3 (three) times daily.   levonorgestrel (MIRENA, 52 MG,) 20 MCG/DAY IUD 1 each by Intrauterine route once.   sertraline (ZOLOFT) 100 MG tablet Take 1.5 tablets (150 mg total) by mouth daily.   [DISCONTINUED] naltrexone (DEPADE) 50 MG tablet Take 1 tablet (50 mg total) by mouth daily.   [DISCONTINUED] QUEtiapine (SEROQUEL) 50 MG tablet Take 1 tablet (50 mg total) by mouth at bedtime.   [DISCONTINUED] levonorgestrel (MIRENA) 20 MCG/24HR IUD 1 Intra Uterine Device (1 each total) by Intrauterine route once for 1 dose.   No facility-administered medications prior to visit.     Objective    BP 125/80 (BP Location: Right Arm, Patient Position: Sitting, Cuff Size: Normal)   Pulse 94   Ht 5\' 5"  (1.651 m)   Wt 153 lb (69.4 kg)   SpO2 97%   BMI 25.46 kg/m   Physical Exam Vitals and nursing note reviewed.  Constitutional:      General: She is not in acute distress.    Appearance: Normal appearance. She is normal weight. She is not  ill-appearing, toxic-appearing or diaphoretic.  HENT:     Head: Normocephalic and atraumatic.  Cardiovascular:     Rate and Rhythm: Normal rate and regular rhythm.     Pulses: Normal pulses.     Heart sounds: Normal heart sounds. No murmur heard.    No friction rub. No gallop.  Pulmonary:     Effort: Pulmonary effort is normal. No respiratory distress.     Breath sounds: Normal breath sounds. No stridor. No wheezing, rhonchi or rales.  Chest:     Chest wall: No tenderness.  Musculoskeletal:        General: No swelling, tenderness, deformity or signs of injury. Normal range of motion.     Right lower leg: No edema.     Left lower leg: No edema.  Skin:    General: Skin is warm and dry.     Capillary Refill: Capillary refill takes less than 2 seconds.     Coloration: Skin is not jaundiced or pale.     Findings: No bruising, erythema, lesion or rash.  Neurological:     General: No focal deficit present.     Mental Status: She is alert and oriented to person, place, and time. Mental status is at baseline.     Cranial Nerves: No cranial nerve deficit.     Sensory: No sensory deficit.  Motor: No weakness.     Coordination: Coordination normal.  Psychiatric:        Mood and Affect: Mood is anxious and depressed.        Behavior: Behavior normal.        Thought Content: Thought content normal. Thought content does not include suicidal ideation. Thought content does not include suicidal plan.        Judgment: Judgment normal.     No results found for any visits on 10/20/23.  Assessment & Plan     Problem List Items Addressed This Visit       Other   Alcohol use disorder, severe, dependence (HCC)   Cocaine use disorder, mild, in early remission (HCC)   Mood disorder (HCC)   Return in about 6 weeks (around 12/01/2023) for anxiety and depression.     Leilani Merl, FNP, have reviewed all documentation for this visit. The documentation on 10/20/23 for the exam, diagnosis,  procedures, and orders are all accurate and complete.  Jacky Kindle, FNP  [facility] [phone] (phone) [phone] (fax)  [facility]

## 2023-10-20 NOTE — Assessment & Plan Note (Signed)
Acute on chronic; reports no additional relief with seroquel Plan to wean slowly x 25% reduction the next 4 weeks with cross titration of ambien 5-10 mg to assist

## 2023-10-29 ENCOUNTER — Ambulatory Visit: Payer: Self-pay

## 2023-10-29 NOTE — Telephone Encounter (Signed)
  Chief Complaint: rash Symptoms: rash on arms and upper legs, itching, feeling feverish and nausea  Frequency: 2 days, nausea been ongoing  Pertinent Negatives: NA Disposition: [] ED /[] Urgent Care (no appt availability in office) / [x] Appointment(In office/virtual)/ []  [facility]/ [] Home Care/ [] Refused Recommended Disposition /[] [facility]/ []  Follow-up with PCP Additional Notes: pt states rash started 2 days ago, looks like poison oak but hasn't been exposed to that. Has been feeling feverish but hadn't checked temp. Has been taking Benadryl to manage the itching but rash not improving. Scheduled pt with Retia Passe, PA tomorrow at [phone] d/t first available. Care advice given and pt verbalized understanding.   Reason for Disposition  SEVERE itching (i.e., interferes with sleep, normal activities or school)  Answer Assessment - Initial Assessment Questions 1. APPEARANCE of RASH: "Describe the rash." (e.g., spots, blisters, raised areas, skin peeling, scaly)     Similar to poison oak  3. LOCATION: "Where is the rash located?"     Arms, on legs from knee up to thigh  4. COLOR: "What color is the rash?" (Note: It is difficult to assess rash color in people with darker-colored skin. When this situation occurs, simply ask the caller to describe what they see.)     red 5. ONSET: "When did the rash begin?"     2 days  6. FEVER: "Do you have a fever?" If Yes, ask: "What is your temperature, how was it measured, and when did it start?"     Feels like fever but hadn't checked  7. ITCHING: "Does the rash itch?" If Yes, ask: "How bad is the itch?" (Scale 1-10; or mild, moderate, severe)     Moderate  9. MEDICINE FACTORS: "Have you started any new medicines within the last 2 weeks?" (e.g., antibiotics)      no 10. OTHER SYMPTOMS: "Do you have any other symptoms?" (e.g., dizziness, headache, sore throat, joint pain)       Nausea, decreased appetite  Protocols used: Rash  or Redness - [facility]

## 2023-10-30 ENCOUNTER — Encounter: Payer: Self-pay | Admitting: Physician Assistant

## 2023-10-30 ENCOUNTER — Ambulatory Visit: Payer: BC Managed Care – PPO | Admitting: Physician Assistant

## 2023-10-30 VITALS — BP 128/84 | HR 100 | Temp 98.1°F | Resp 16 | Ht 65.0 in | Wt 150.5 lb

## 2023-10-30 DIAGNOSIS — R110 Nausea: Secondary | ICD-10-CM | POA: Diagnosis not present

## 2023-10-30 DIAGNOSIS — R21 Rash and other nonspecific skin eruption: Secondary | ICD-10-CM

## 2023-10-30 LAB — POCT RAPID STREP A (OFFICE): Rapid Strep A Screen: NEGATIVE

## 2023-10-30 MED ORDER — ONDANSETRON HCL 4 MG PO TABS: 4.0000 mg | ORAL_TABLET | Freq: Three times a day (TID) | ORAL | 0 refills | Status: DC | PRN

## 2023-10-30 MED ORDER — PREDNISONE 20 MG PO TABS: ORAL_TABLET | ORAL | 0 refills | Status: DC

## 2023-10-30 NOTE — Progress Notes (Unsigned)
Acute Office Visit   Patient: Kaitlin Phillips   DOB: February 04, 1986   37 y.o. Female  MRN: [id number] Visit Date: 10/30/2023  Today's healthcare provider: Oswaldo Conroy Leviathan Macera, PA-C  Introduced myself to the patient as a Secondary school teacher and provided education on APPs in clinical practice.    Chief Complaint  Patient presents with   Rash     rash on arms and upper legs, itching, feeling feverish and nausea x3 days   Subjective    HPI HPI     Rash    Additional comments:  rash on arms and upper legs, itching, feeling feverish and nausea x3 days      Last edited by Forde Radon, CMA on 10/30/2023  9:28 AM.      RASH Duration:  days -started 3 days ago  Location: generalized, trunk, arms, and legs  Itching: yes Burning: yes Redness: yes Oozing: no Scaling: no Blisters: no Painful: no Fevers: yes- subjective   She also reports nausea, headaches, body aches, tremors  She reports she is having severe dry mouth  She has been trying to drink more fluids and soups to help with symptoms  She denies stopping any recent opioids or other medications/substances  She reports some slip ups with sobriety   Change in detergents/soaps/personal care products: no Recent illness: no Recent travel:no History of same: no Context: worse. She denies similar symptoms in other members of her house Alleviating factors: nothing Treatments attempted:hydrocortisone cream Shortness of breath: no  Throat/tongue swelling: no Myalgias/arthralgias: yes   Medications: Outpatient Medications Prior to Visit  Medication Sig   busPIRone (BUSPAR) 15 MG tablet Take 1 tablet (15 mg total) by mouth 3 (three) times daily.   gabapentin (NEURONTIN) 300 MG capsule Take 1 capsule (300 mg total) by mouth 3 (three) times daily.   levonorgestrel (MIRENA, 52 MG,) 20 MCG/DAY IUD 1 each by Intrauterine route once.   sertraline (ZOLOFT) 100 MG tablet Take 1.5 tablets (150 mg total) by mouth daily.   zolpidem  (AMBIEN) 5 MG tablet Take 1-2 tablets at night to assist with sleep   No facility-administered medications prior to visit.    Review of Systems  Constitutional:  Positive for chills, fatigue and fever.  Respiratory:  Positive for cough.   Gastrointestinal:  Positive for nausea.  Musculoskeletal:  Positive for myalgias.  Skin:  Positive for rash.    {Insert previous labs (optional):23779} {See past labs  Heme  Chem  Endocrine  Serology  Results Review (optional):1}   Objective    BP 128/84   Pulse 100   Temp 98.1 F (36.7 C) (Oral)   Resp 16   Ht 5\' 5"  (1.651 m)   Wt 150 lb 8 oz (68.3 kg)   SpO2 99%   BMI 25.04 kg/m  {Insert last BP/Wt (optional):23777}{See vitals history (optional):1}   Physical Exam Vitals reviewed.  Constitutional:      General: She is awake.     Appearance: Normal appearance. She is well-developed and well-groomed.  HENT:     Head: Normocephalic and atraumatic.  Pulmonary:     Effort: Pulmonary effort is normal.  Skin:    General: Skin is warm.     Findings: Erythema and rash present. No lesion. Rash is macular and papular. Rash is not crusting, nodular, urticarial or vesicular.     Comments: Widespread maculopapular rash along thighs, forearms  No obvious tracts or tunnels  No erythematous base or signs of  fungal infection at this time    Neurological:     Mental Status: She is alert.  Psychiatric:        Behavior: Behavior is cooperative.       No results found for any visits on 10/30/23.  Assessment & Plan      No follow-ups on file.

## 2023-10-30 NOTE — Progress Notes (Signed)
Your rapid strep test was negative. Please proceed with the management plan as discussed during your apt.

## 2023-10-30 NOTE — Patient Instructions (Signed)
I also recommend adding an antihistamine to your daily regimen This includes medications like Claritin, Allegra, Zyrtec- the generics of these work very well and are usually less expensive The antihistamines and Flonase can take a few weeks to provide significant relief from allergy symptoms but should start to provide some benefit soon.  I have sent in a script for Prednisone taper to be taken in the morning with breakfast per the instructions on the container Remember that steroids can cause sleeplessness, irritability, increased hunger and elevated glucose levels so be mindful of these side effects. They should lessen as you progress to the lower doses of the taper.  I have sent in a script for zofran for your nausea and vomiting

## 2023-12-08 ENCOUNTER — Ambulatory Visit: Payer: BC Managed Care – PPO | Admitting: Family Medicine

## 2023-12-08 ENCOUNTER — Encounter: Payer: Self-pay | Admitting: Family Medicine

## 2023-12-08 VITALS — BP 121/75 | HR 89 | Temp 98.1°F | Ht 65.0 in | Wt 151.0 lb

## 2023-12-08 DIAGNOSIS — F39 Unspecified mood [affective] disorder: Secondary | ICD-10-CM

## 2023-12-08 DIAGNOSIS — J440 Chronic obstructive pulmonary disease with acute lower respiratory infection: Secondary | ICD-10-CM | POA: Diagnosis not present

## 2023-12-08 DIAGNOSIS — F1020 Alcohol dependence, uncomplicated: Secondary | ICD-10-CM | POA: Diagnosis not present

## 2023-12-08 DIAGNOSIS — J209 Acute bronchitis, unspecified: Secondary | ICD-10-CM | POA: Insufficient documentation

## 2023-12-08 DIAGNOSIS — F1411 Cocaine abuse, in remission: Secondary | ICD-10-CM

## 2023-12-08 MED ORDER — NALTREXONE HCL 50 MG PO TABS: 50.0000 mg | ORAL_TABLET | Freq: Every day | ORAL | 1 refills | Status: DC

## 2023-12-08 MED ORDER — DOXYCYCLINE HYCLATE 100 MG PO TABS: 100.0000 mg | ORAL_TABLET | Freq: Two times a day (BID) | ORAL | 0 refills | Status: DC

## 2023-12-08 MED ORDER — AZITHROMYCIN 500 MG PO TABS: 500.0000 mg | ORAL_TABLET | Freq: Every day | ORAL | 0 refills | Status: DC

## 2023-12-08 MED ORDER — PREDNISONE 50 MG PO TABS: 50.0000 mg | ORAL_TABLET | Freq: Every day | ORAL | 0 refills | Status: DC

## 2023-12-08 NOTE — Assessment & Plan Note (Signed)
Chronic, stable Despite relapse on ETOH; has not relapsed on cocaine CTM

## 2023-12-08 NOTE — Assessment & Plan Note (Signed)
Acute, worsening x 7 days Failed conservative treatment Recommend abx and steroids to assist CTM

## 2023-12-08 NOTE — Patient Instructions (Addendum)
[facility]  Address: [address] [place], [place] [address] Phone: [phone]

## 2023-12-08 NOTE — Assessment & Plan Note (Signed)
 Chronic, worsening Patient has self adjusted medications to assist; however, worsening symptoms with relapse into ETOH use and abuse Denies SI or HI Encouraged to seek [facility] or psychiatric assistance to assist Self pay discount provided for naltrexone  to assist

## 2023-12-08 NOTE — Progress Notes (Signed)
 Established patient visit   Patient: Kaitlin Phillips   DOB: [birth year]   37 y.o. Female  MRN: [id number] Visit Date: 12/08/2023  Today's healthcare provider: Kelly ONEIDA Cedar, FNP  Introduced to nurse practitioner role and practice setting.  All questions answered.  Discussed provider/patient relationship and expectations.  Subjective    HPI HPI   Anxiety and depression--increase drinking/craving alcohol Congested, coughing, fatigue, vomiting--1 week Last edited by Deitra Therisa HERO, CMA on 12/08/2023  9:52 AM.         12/08/2023    9:55 AM 10/30/2023    9:24 AM 10/20/2023    1:14 PM  PHQ9 SCORE ONLY  PHQ-9 Total Score 10 0 12      12/08/2023    9:55 AM 10/20/2023    1:15 PM 08/19/2023   10:19 AM 06/18/2023   11:12 AM  GAD 7 : Generalized Anxiety Score  Nervous, Anxious, on Edge 1 1 2 3   Control/stop worrying 2 2 2 3   Worry too much - different things 2 3 2 3   Trouble relaxing 2 2 2  0  Restless 2 1 2 1   Easily annoyed or irritable 2 2 2 1   Afraid - awful might happen 0 0 1 0  Total GAD 7 Score 11 11 13 11   Anxiety Difficulty Somewhat difficult  Somewhat difficult Not difficult at all    Medications: Outpatient Medications Prior to Visit  Medication Sig   busPIRone  (BUSPAR ) 15 MG tablet Take 1 tablet (15 mg total) by mouth 3 (three) times daily. (Patient taking differently: Take 15 mg by mouth 3 (three) times daily. Taking 2 a tab a day)   gabapentin  (NEURONTIN ) 300 MG capsule Take 1 capsule (300 mg total) by mouth 3 (three) times daily.   levonorgestrel  (MIRENA , 52 MG,) 20 MCG/DAY IUD 1 each by Intrauterine route once.   ondansetron  (ZOFRAN ) 4 MG tablet Take 1 tablet (4 mg total) by mouth every 8 (eight) hours as needed for nausea or vomiting.   sertraline  (ZOLOFT ) 100 MG tablet Take 1.5 tablets (150 mg total) by mouth daily. (Patient taking differently: Take 150 mg by mouth daily. Taking 1 tab)   [DISCONTINUED] predniSONE  (DELTASONE ) 20 MG tablet Take 60mg  PO daily x 2  days, then40mg  PO daily x 2 days, then 20mg  PO daily x 3 days   zolpidem  (AMBIEN ) 5 MG tablet Take 1-2 tablets at night to assist with sleep (Patient not taking: Reported on 12/08/2023)   No facility-administered medications prior to visit.   Last CBC Lab Results  Component Value Date   WBC 6.0 08/19/2023   HGB 14.8 08/19/2023   HCT 43.3 08/19/2023   MCV 101 (H) 08/19/2023   MCH 34.6 (H) 08/19/2023   RDW 11.8 08/19/2023   PLT 159 08/19/2023   Last metabolic panel Lab Results  Component Value Date   GLUCOSE 79 08/19/2023   NA 138 08/19/2023   K 4.1 08/19/2023   CL 103 08/19/2023   CO2 21 08/19/2023   BUN 15 08/19/2023   CREATININE 0.64 08/19/2023   EGFR 116 08/19/2023   CALCIUM  9.5 08/19/2023   PROT 6.5 08/19/2023   ALBUMIN 4.4 08/19/2023   LABGLOB 2.1 08/19/2023   BILITOT 0.3 08/19/2023   ALKPHOS 49 08/19/2023   AST 22 08/19/2023   ALT 15 08/19/2023   ANIONGAP 9 07/20/2023   Last lipids No results found for: CHOL, HDL, LDLCALC, LDLDIRECT, TRIG, CHOLHDL Last hemoglobin A1c Lab Results  Component Value Date  HGBA1C 4.9 08/19/2023   Last thyroid functions Lab Results  Component Value Date   TSH 1.890 08/19/2023   Last vitamin D  Lab Results  Component Value Date   VD25OH 24.0 (L) 08/19/2023   Last vitamin B12 and Folate Lab Results  Component Value Date   VITAMINB12 664 08/19/2023   FOLATE 6.3 08/19/2023     Objective    BP 121/75 (BP Location: Right Arm, Patient Position: Sitting, Cuff Size: Normal)   Pulse 89   Temp 98.1 F (36.7 C)   Ht 5' 5 (1.651 m)   Wt 151 lb (68.5 kg)   SpO2 97%   BMI 25.13 kg/m   BP Readings from Last 3 Encounters:  12/08/23 121/75  10/30/23 128/84  10/20/23 125/80   Wt Readings from Last 3 Encounters:  12/08/23 151 lb (68.5 kg)  10/30/23 150 lb 8 oz (68.3 kg)  10/20/23 153 lb (69.4 kg)   SpO2 Readings from Last 3 Encounters:  12/08/23 97%  10/30/23 99%  10/20/23 97%   Physical Exam Vitals and  nursing note reviewed.  Constitutional:      General: She is not in acute distress.    Appearance: Normal appearance. She is overweight. She is ill-appearing. She is not toxic-appearing or diaphoretic.  HENT:     Head: Normocephalic and atraumatic.  Cardiovascular:     Rate and Rhythm: Normal rate and regular rhythm.     Pulses: Normal pulses.     Heart sounds: Normal heart sounds. No murmur heard.    No friction rub. No gallop.  Pulmonary:     Effort: Pulmonary effort is normal. No respiratory distress.     Breath sounds: No stridor. Wheezing present. No rhonchi or rales.  Chest:     Chest wall: No tenderness.  Musculoskeletal:        General: No swelling, tenderness, deformity or signs of injury. Normal range of motion.     Right lower leg: No edema.     Left lower leg: No edema.  Skin:    General: Skin is warm and dry.     Capillary Refill: Capillary refill takes less than 2 seconds.     Coloration: Skin is not jaundiced or pale.     Findings: No bruising, erythema, lesion or rash.  Neurological:     General: No focal deficit present.     Mental Status: She is alert and oriented to person, place, and time. Mental status is at baseline.     Cranial Nerves: No cranial nerve deficit.     Sensory: No sensory deficit.     Motor: No weakness.     Coordination: Coordination normal.  Psychiatric:        Mood and Affect: Mood is depressed. Affect is tearful.        Behavior: Behavior normal.        Thought Content: Thought content normal.        Judgment: Judgment normal.     Comments: Denies SI or HI     No results found for any visits on 12/08/23.  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 Defer use of benzo at this time. Will repeat trial of naltrexone  to assist; pt reports mom will assist with payment of medication Name/address/phone provided of [facility] to provided      Relevant Medications   naltrexone  (DEPADE) 50 MG tablet   Cocaine use disorder, mild, in early remission (HCC)   Chronic, stable Despite relapse on ETOH; has not relapsed on cocaine CTM      Relevant Medications   naltrexone  (DEPADE) 50 MG tablet   Mood disorder (HCC)   Chronic, worsening Patient has self adjusted medications to assist; however, worsening symptoms with relapse into ETOH use and abuse Denies SI or HI Encouraged to seek [facility] or psychiatric assistance to assist Self pay discount provided for naltrexone  to assist      Relevant Medications   naltrexone  (DEPADE) 50 MG tablet   Return if symptoms worsen or fail to improve.     LILLETTE Kelly ONEIDA Emilio, FNP, have reviewed all documentation for this visit. The documentation on 12/08/23 for the exam, diagnosis, procedures, and orders are all accurate and complete.  Kelly ONEIDA Emilio, FNP  [facility] [phone] (phone) [phone] (fax)  [facility]

## 2023-12-08 NOTE — Assessment & Plan Note (Signed)
 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 Defer use of benzo at this time. Will repeat trial of naltrexone  to assist; pt reports mom will assist with payment of medication Name/address/phone provided of [facility] to provided

## 2024-02-08 ENCOUNTER — Emergency Department

## 2024-02-08 ENCOUNTER — Other Ambulatory Visit: Payer: Self-pay

## 2024-02-08 ENCOUNTER — Emergency Department
Admission: EM | Admit: 2024-02-08 | Discharge: 2024-02-09 | Disposition: A | Discharge status: Home / Self Care | Attending: Emergency Medicine | Admitting: Emergency Medicine

## 2024-02-08 DIAGNOSIS — R197 Diarrhea, unspecified: Secondary | ICD-10-CM | POA: Diagnosis not present

## 2024-02-08 DIAGNOSIS — F10129 Alcohol abuse with intoxication, unspecified: Secondary | ICD-10-CM | POA: Diagnosis not present

## 2024-02-08 DIAGNOSIS — R252 Cramp and spasm: Secondary | ICD-10-CM | POA: Diagnosis not present

## 2024-02-08 DIAGNOSIS — Y908 Blood alcohol level of 240 mg/100 ml or more: Secondary | ICD-10-CM | POA: Diagnosis not present

## 2024-02-08 DIAGNOSIS — R1030 Lower abdominal pain, unspecified: Secondary | ICD-10-CM | POA: Diagnosis not present

## 2024-02-08 DIAGNOSIS — R569 Unspecified convulsions: Secondary | ICD-10-CM | POA: Insufficient documentation

## 2024-02-08 DIAGNOSIS — F10920 Alcohol use, unspecified with intoxication, uncomplicated: Secondary | ICD-10-CM | POA: Diagnosis present

## 2024-02-08 DIAGNOSIS — R112 Nausea with vomiting, unspecified: Secondary | ICD-10-CM | POA: Diagnosis not present

## 2024-02-08 DIAGNOSIS — F10929 Alcohol use, unspecified with intoxication, unspecified: Secondary | ICD-10-CM

## 2024-02-08 LAB — CBC WITH DIFFERENTIAL/PLATELET
Abs Immature Granulocytes: 0.01 10*3/uL (ref 0.00–0.07)
Basophils Absolute: 0 10*3/uL (ref 0.0–0.1)
Basophils Relative: 1 %
Eosinophils Absolute: 0.1 10*3/uL (ref 0.0–0.5)
Eosinophils Relative: 1 %
HCT: 41.2 % (ref 36.0–46.0)
Hemoglobin: 14.1 g/dL (ref 12.0–15.0)
Immature Granulocytes: 0 %
Lymphocytes Relative: 44 %
Lymphs Abs: 2.2 10*3/uL (ref 0.7–4.0)
MCH: 34.6 pg — ABNORMAL HIGH (ref 26.0–34.0)
MCHC: 34.2 g/dL (ref 30.0–36.0)
MCV: 101.2 fL — ABNORMAL HIGH (ref 80.0–100.0)
Monocytes Absolute: 0.4 10*3/uL (ref 0.1–1.0)
Monocytes Relative: 8 %
Neutro Abs: 2.2 10*3/uL (ref 1.7–7.7)
Neutrophils Relative %: 46 %
Platelets: 136 10*3/uL — ABNORMAL LOW (ref 150–400)
RBC: 4.07 MIL/uL (ref 3.87–5.11)
RDW: 13.1 % (ref 11.5–15.5)
WBC: 4.9 10*3/uL (ref 4.0–10.5)
nRBC: 0 % (ref 0.0–0.2)

## 2024-02-08 LAB — LIPASE, BLOOD: Lipase: 58 U/L — ABNORMAL HIGH (ref 11–51)

## 2024-02-08 LAB — URINALYSIS, W/ REFLEX TO CULTURE (INFECTION SUSPECTED)
Bacteria, UA: NONE SEEN
Bilirubin Urine: NEGATIVE
Glucose, UA: NEGATIVE mg/dL
Hgb urine dipstick: NEGATIVE
Ketones, ur: NEGATIVE mg/dL
Leukocytes,Ua: NEGATIVE
Nitrite: NEGATIVE
Protein, ur: NEGATIVE mg/dL
Specific Gravity, Urine: 1.009 (ref 1.005–1.030)
pH: 6 (ref 5.0–8.0)

## 2024-02-08 LAB — URINE DRUG SCREEN, QUALITATIVE (ARMC ONLY)
Amphetamines, Ur Screen: NOT DETECTED
Barbiturates, Ur Screen: NOT DETECTED
Benzodiazepine, Ur Scrn: POSITIVE — AB
Cannabinoid 50 Ng, Ur ~~LOC~~: NOT DETECTED
Cocaine Metabolite,Ur ~~LOC~~: NOT DETECTED
MDMA (Ecstasy)Ur Screen: NOT DETECTED
Methadone Scn, Ur: NOT DETECTED
Opiate, Ur Screen: NOT DETECTED
Phencyclidine (PCP) Ur S: NOT DETECTED
Tricyclic, Ur Screen: NOT DETECTED

## 2024-02-08 LAB — RESP PANEL BY RT-PCR (RSV, FLU A&B, COVID)  RVPGX2
Influenza A by PCR: NEGATIVE
Influenza B by PCR: NEGATIVE
Resp Syncytial Virus by PCR: NEGATIVE
SARS Coronavirus 2 by RT PCR: NEGATIVE

## 2024-02-08 LAB — MAGNESIUM: Magnesium: 1.8 mg/dL (ref 1.7–2.4)

## 2024-02-08 LAB — HCG, QUANTITATIVE, PREGNANCY: hCG, Beta Chain, Quant, S: <1 m[IU]/mL (ref <5)

## 2024-02-08 LAB — COMPREHENSIVE METABOLIC PANEL
ALT: 21 U/L (ref 0–44)
AST: 43 U/L — ABNORMAL HIGH (ref 15–41)
Albumin: 4 g/dL (ref 3.5–5.0)
Alkaline Phosphatase: 42 U/L (ref 38–126)
Anion gap: 11 (ref 5–15)
BUN: 8 mg/dL (ref 6–20)
CO2: 27 mmol/L (ref 22–32)
Calcium: 8.9 mg/dL (ref 8.9–10.3)
Chloride: 102 mmol/L (ref 98–111)
Creatinine, Ser: 0.63 mg/dL (ref 0.44–1.00)
GFR, Estimated: >60 mL/min (ref >60)
Glucose, Bld: 99 mg/dL (ref 70–99)
Potassium: 3.6 mmol/L (ref 3.5–5.1)
Sodium: 140 mmol/L (ref 135–145)
Total Bilirubin: 0.5 mg/dL (ref 0.0–1.2)
Total Protein: 6.9 g/dL (ref 6.5–8.1)

## 2024-02-08 LAB — TROPONIN I (HIGH SENSITIVITY)
Troponin I (High Sensitivity): <2 ng/L (ref <18)
Troponin I (High Sensitivity): 3 ng/L (ref <18)

## 2024-02-08 LAB — ETHANOL: Alcohol, Ethyl (B): 313 mg/dL (ref <10)

## 2024-02-08 LAB — BRAIN NATRIURETIC PEPTIDE: B Natriuretic Peptide: 20.9 pg/mL (ref 0.0–100.0)

## 2024-02-08 LAB — PHOSPHORUS: Phosphorus: 4 mg/dL (ref 2.5–4.6)

## 2024-02-08 MED ORDER — LORAZEPAM 2 MG/ML IJ SOLN
1.0000 mg | INTRAMUSCULAR | Status: DC | PRN
  Administered 2024-02-08: 2 mg via INTRAVENOUS
  Filled 2024-02-08: qty 1

## 2024-02-08 MED ORDER — IOHEXOL 300 MG/ML  SOLN
100.0000 mL | Freq: Once | INTRAMUSCULAR | Status: AC | PRN
  Administered 2024-02-08: 100 mL via INTRAVENOUS

## 2024-02-08 MED ORDER — LACTATED RINGERS IV BOLUS
1000.0000 mL | Freq: Once | INTRAVENOUS | Status: AC
  Administered 2024-02-08: 1000 mL via INTRAVENOUS

## 2024-02-08 MED ORDER — ADULT MULTIVITAMIN W/MINERALS CH
1.0000 | ORAL_TABLET | Freq: Every day | ORAL | Status: DC
  Administered 2024-02-08: 1 via ORAL
  Filled 2024-02-08: qty 1

## 2024-02-08 MED ORDER — FOLIC ACID 1 MG PO TABS
1.0000 mg | ORAL_TABLET | Freq: Every day | ORAL | Status: DC
  Administered 2024-02-08: 1 mg via ORAL
  Filled 2024-02-08: qty 1

## 2024-02-08 MED ORDER — GABAPENTIN 300 MG PO CAPS
300.0000 mg | ORAL_CAPSULE | Freq: Once | ORAL | Status: AC
  Administered 2024-02-08: 300 mg via ORAL
  Filled 2024-02-08: qty 1

## 2024-02-08 MED ORDER — LORAZEPAM 1 MG PO TABS: 1.0000 mg | ORAL_TABLET | ORAL | Status: DC | PRN

## 2024-02-08 MED ORDER — THIAMINE MONONITRATE 100 MG PO TABS: 100.0000 mg | ORAL_TABLET | Freq: Every day | ORAL | Status: DC

## 2024-02-08 MED ORDER — THIAMINE HCL 100 MG/ML IJ SOLN
100.0000 mg | Freq: Every day | INTRAMUSCULAR | Status: DC
  Administered 2024-02-08: 100 mg via INTRAVENOUS
  Filled 2024-02-08: qty 2

## 2024-02-08 NOTE — ED Provider Notes (Addendum)
 Kaitlin Phillips Provider Note    Event Date/Time   First MD Initiated Contact with Patient 02/08/24 2044     (approximate)   History   Seizures   HPI  Kaitlin Phillips is a 38 y.o. female history of seizures, endometriosis, GERD, history of alcohol use, substance use, presenting with seizures.  Per EMS, husband called because patient had 10 seizures within a 15-minute span.  Is supposed to be on gabapentin for seizures, patient states that she did not take it tonight.  EMS said that they gave her 2.5 mg of Versed, states after that she was ANO x 4.  Patient states that she has been having nausea, vomiting, diarrhea and abdominal pain for last couple days as well as chest pain, shortness of breath and a cough.  Initially states that she had a fever at home but did not take her temperature.  No dysuria or hematuria.  States that her husband thought that she had blood in her vomit but patient thinks that this was from her hamburger that she ate.  States that her diarrhea was dark but nonbloody.  States that last alcohol use was 2 days ago, denies other drug use.  States that she has had withdrawals before, but is unsure if she has had withdrawal seizures.  Also has bilateral lower extremity cramping.  Independent history obtained from EMS  On independent chart review, she was seen by primary care doctor in December, has history of severe alcohol use, was placed on a trial of naltrexone.  Has history of cocaine use in the past.        Physical Exam   Triage Vital Signs: ED Triage Vitals  Encounter Vitals Group     BP      Systolic BP Percentile      Diastolic BP Percentile      Pulse      Resp      Temp      Temp src      SpO2      Weight      Height      Head Circumference      Peak Flow      Pain Score      Pain Loc      Pain Education      Exclude from Growth Chart     Most recent vital signs: Vitals:   02/08/24 2200 02/08/24 2230  BP: 115/82  117/84  Pulse: 76 77  Resp: 15 17  Temp:    SpO2: 100% 93%     General: Awake, no distress.  CV:  Good peripheral perfusion.  Resp:  Normal effort.  No respiratory distress or tachypnea Abd:  No distention.  Soft, tender in the lower quadrants Other:  No unilateral calf swelling, no edema bilaterally, no focal weakness or numbness, no cranial nerve deficits.  Rectal exam was done with chaperone, brown stool in rectal vault.  No tongue fasciculations or tremors.   ED Results / Procedures / Treatments   Labs (all labs ordered are listed, but only abnormal results are displayed) Labs Reviewed  COMPREHENSIVE METABOLIC PANEL - Abnormal; Notable for the following components:      Result Value   AST 43 (*)    All other components within normal limits  ETHANOL - Abnormal; Notable for the following components:   Alcohol, Ethyl (B) 313 (*)    All other components within normal limits  LIPASE, BLOOD - Abnormal; Notable for  the following components:   Lipase 58 (*)    All other components within normal limits  CBC WITH DIFFERENTIAL/PLATELET - Abnormal; Notable for the following components:   MCV 101.2 (*)    MCH 34.6 (*)    Platelets 136 (*)    All other components within normal limits  URINALYSIS, W/ REFLEX TO CULTURE (INFECTION SUSPECTED) - Abnormal; Notable for the following components:   Color, Urine STRAW (*)    APPearance CLEAR (*)    All other components within normal limits  RESP PANEL BY RT-PCR (RSV, FLU A&B, COVID)  RVPGX2  BRAIN NATRIURETIC PEPTIDE  HCG, QUANTITATIVE, PREGNANCY  MAGNESIUM  PHOSPHORUS  URINE DRUG SCREEN, QUALITATIVE ([facility] ONLY)  TROPONIN I (HIGH SENSITIVITY)  TROPONIN I (HIGH SENSITIVITY)     EKG  Sinus tachycardia, rate 103, normal QRS, normal QTc, T wave flattening in aVL, no ischemic ST elevation, not significant change compared to prior   RADIOLOGY Chest x-ray on my interpretation without obvious consolidation.  CT head on my interpretation  without obvious intracranial hemorrhage.   PROCEDURES:  Critical Care performed: No  Procedures   MEDICATIONS ORDERED IN ED: Medications  thiamine (VITAMIN B1) tablet 100 mg ( Oral See Alternative 02/08/24 2112)    Or  thiamine (VITAMIN B1) injection 100 mg (100 mg Intravenous Given 02/08/24 2112)  folic acid (FOLVITE) tablet 1 mg (1 mg Oral Given 02/08/24 2112)  multivitamin with minerals tablet 1 tablet (1 tablet Oral Given 02/08/24 2112)  lactated ringers bolus 1,000 mL (1,000 mLs Intravenous New Bag/Given 02/08/24 2111)  gabapentin (NEURONTIN) capsule 300 mg (300 mg Oral Given 02/08/24 2112)  iohexol (OMNIPAQUE) 300 MG/ML solution 100 mL (100 mLs Intravenous Contrast Given 02/08/24 2211)     IMPRESSION / MDM / ASSESSMENT AND PLAN / ED COURSE  I reviewed the triage vital signs and the nursing notes.                              Differential diagnosis includes, but is not limited to, breakthrough seizure, electrolyte derangements, gastroenteritis, colitis, food poisoning, diverticulitis, viral illness, influenza, COVID, RSV, pneumonia, ACS, anxiety, considered CHF but she is not volume overloaded, considered PE for shortness of breath but she also has an associated cough, no unilateral calf swelling or tenderness, not on any hormone treatment, no history of DVTs, no history of malignancy, this is lower on the differential.  Also considered withdrawal seizures but she is not tremulous, has no tongue fasciculations at this time.  Will monitor her, get a CT head, alcohol level, drug screen, CT abdomen pelvis, labs, EKG.  Will give her some fluids.  Will also give her her nighttime gabapentin dose.  Will also put her on a CIWA protocol here.  Patient's presentation is most consistent with acute presentation with potential threat to life or bodily function.  Independent review of labs and imaging are below.  Patient signed out to overnight team pending CT imaging results as well as observation for  sobriety.  Clinical Course as of 02/08/24 2315  Mon Feb 08, 2024  2232 Alcohol, Ethyl (B)(!!): 313 Elevated  [TT]  2315 DG Chest Port 1 View Hypoventilatory chest.  [TT]  2315 Independent review of labs, lipase is mildly elevated, electrolytes not severely deranged, no leukocytosis, UA not consistent with UTI, magnesium, phosphate levels are normal, beta quant is negative, troponin and BNP are not elevated. [TT]    Clinical Course User Index [TT] Jodie Echevaria,  Franchot Erichsen, MD     FINAL CLINICAL IMPRESSION(S) / ED DIAGNOSES   Final diagnoses:  Seizure (HCC)  Nausea vomiting and diarrhea  Lower abdominal pain  Alcoholic intoxication with complication (HCC)     Rx / DC Orders   ED Discharge Orders     None        Note:  This document was prepared using Dragon voice recognition software and may include unintentional dictation errors.    Claybon Jabs, MD 02/08/24 Juliann Pares    Claybon Jabs, MD 02/08/24 [phone]

## 2024-02-08 NOTE — ED Triage Notes (Signed)
 Pt arrives via ems from home with c/o seizure activity. Pt has hx of seizures and ETOH usage. Last ETOH beverage was 2 days ago. Per the pt's husband the pt had 10 seizures within 15 minutes. Hx of gabapentin usage. EMS gave 2.5mg  of versed IV. No blood thinners but pt reports bloody diarrhea. Pt is A&Ox4.

## 2024-02-08 NOTE — ED Notes (Signed)
 This RN introduced self to pt. Call light in reach, bed wheels locked, side rail raised, pt updated on plan of care. Rounding completed.

## 2024-02-09 ENCOUNTER — Emergency Department

## 2024-02-09 ENCOUNTER — Other Ambulatory Visit: Payer: Self-pay

## 2024-02-09 ENCOUNTER — Emergency Department
Admission: EM | Admit: 2024-02-09 | Discharge: 2024-02-09 | Disposition: A | Discharge status: Home / Self Care | Source: Home / Self Care | Attending: Emergency Medicine | Admitting: Emergency Medicine

## 2024-02-09 DIAGNOSIS — Y908 Blood alcohol level of 240 mg/100 ml or more: Secondary | ICD-10-CM | POA: Insufficient documentation

## 2024-02-09 DIAGNOSIS — F10920 Alcohol use, unspecified with intoxication, uncomplicated: Secondary | ICD-10-CM | POA: Insufficient documentation

## 2024-02-09 LAB — CBC WITH DIFFERENTIAL/PLATELET
Abs Immature Granulocytes: 0.02 10*3/uL (ref 0.00–0.07)
Basophils Absolute: 0 10*3/uL (ref 0.0–0.1)
Basophils Relative: 1 %
Eosinophils Absolute: 0 10*3/uL (ref 0.0–0.5)
Eosinophils Relative: 1 %
HCT: 41.3 % (ref 36.0–46.0)
Hemoglobin: 14.3 g/dL (ref 12.0–15.0)
Immature Granulocytes: 0 %
Lymphocytes Relative: 32 %
Lymphs Abs: 1.6 10*3/uL (ref 0.7–4.0)
MCH: 35.3 pg — ABNORMAL HIGH (ref 26.0–34.0)
MCHC: 34.6 g/dL (ref 30.0–36.0)
MCV: 102 fL — ABNORMAL HIGH (ref 80.0–100.0)
Monocytes Absolute: 0.4 10*3/uL (ref 0.1–1.0)
Monocytes Relative: 8 %
Neutro Abs: 2.9 10*3/uL (ref 1.7–7.7)
Neutrophils Relative %: 58 %
Platelets: 122 10*3/uL — ABNORMAL LOW (ref 150–400)
RBC: 4.05 MIL/uL (ref 3.87–5.11)
RDW: 12.9 % (ref 11.5–15.5)
WBC: 5 10*3/uL (ref 4.0–10.5)
nRBC: 0 % (ref 0.0–0.2)

## 2024-02-09 LAB — URINE DRUG SCREEN, QUALITATIVE (ARMC ONLY)
Amphetamines, Ur Screen: NOT DETECTED
Barbiturates, Ur Screen: NOT DETECTED
Benzodiazepine, Ur Scrn: POSITIVE — AB
Cannabinoid 50 Ng, Ur ~~LOC~~: NOT DETECTED
Cocaine Metabolite,Ur ~~LOC~~: NOT DETECTED
MDMA (Ecstasy)Ur Screen: NOT DETECTED
Methadone Scn, Ur: NOT DETECTED
Opiate, Ur Screen: NOT DETECTED
Phencyclidine (PCP) Ur S: NOT DETECTED
Tricyclic, Ur Screen: NOT DETECTED

## 2024-02-09 LAB — URINALYSIS, ROUTINE W REFLEX MICROSCOPIC
Bilirubin Urine: NEGATIVE
Glucose, UA: NEGATIVE mg/dL
Ketones, ur: NEGATIVE mg/dL
Leukocytes,Ua: NEGATIVE
Nitrite: NEGATIVE
Protein, ur: NEGATIVE mg/dL
Specific Gravity, Urine: 1.003 — ABNORMAL LOW (ref 1.005–1.030)
pH: 7 (ref 5.0–8.0)

## 2024-02-09 LAB — BASIC METABOLIC PANEL
Anion gap: 13 (ref 5–15)
BUN: 7 mg/dL (ref 6–20)
CO2: 26 mmol/L (ref 22–32)
Calcium: 9 mg/dL (ref 8.9–10.3)
Chloride: 100 mmol/L (ref 98–111)
Creatinine, Ser: 0.59 mg/dL (ref 0.44–1.00)
GFR, Estimated: >60 mL/min (ref >60)
Glucose, Bld: 99 mg/dL (ref 70–99)
Potassium: 3.8 mmol/L (ref 3.5–5.1)
Sodium: 139 mmol/L (ref 135–145)

## 2024-02-09 LAB — LIPASE, BLOOD: Lipase: 42 U/L (ref 11–51)

## 2024-02-09 LAB — ETHANOL: Alcohol, Ethyl (B): 427 mg/dL (ref <10)

## 2024-02-09 MED ORDER — LACTATED RINGERS IV BOLUS
1000.0000 mL | Freq: Once | INTRAVENOUS | Status: AC
  Administered 2024-02-09: 1000 mL via INTRAVENOUS

## 2024-02-09 MED ORDER — ONDANSETRON HCL 4 MG/2ML IJ SOLN
4.0000 mg | Freq: Once | INTRAMUSCULAR | Status: AC
  Administered 2024-02-09: 4 mg via INTRAVENOUS
  Filled 2024-02-09: qty 2

## 2024-02-09 NOTE — ED Notes (Addendum)
 Pt husband stating she can't feel her left hand/arm and left leg. Started last night after leaving the hospital. Dr. Anner Crete at bedside. Pt able to feel pinching on left arm, now states numbness only in fingers.

## 2024-02-09 NOTE — ED Notes (Signed)
 Husband, Riki Rusk, called per request and updated on lab work.

## 2024-02-09 NOTE — ED Notes (Signed)
   02/09/24 0000  Vitals  BP (!) 78/50  MAP (mmHg) (!) 60  Pulse Rate 85  MEWS COLOR  MEWS Score Color Yellow  Oxygen Therapy  SpO2 94 %  MEWS Score  MEWS Temp 0  MEWS Systolic 2  MEWS Pulse 0  MEWS RR 0  MEWS LOC 0  MEWS Score 2   Stafford MD aware of BP. Pt stable and readjusted.

## 2024-02-09 NOTE — ED Triage Notes (Signed)
 Patient to ED via [facility]. Patient's family called EMS while taking patient to rehab when they noticed she was acting different. Patient has Hx of alcoholism and is currently relapsing. Patient does state she drank yesterday because her "pawpaw passed away and I was supposed to go to his funeral". EMS states patient also had snoring respirations upon arrival with an O2 sat of 89% on room air. Patient states she took her daily medications but is not if she took more than has been dosed.   EMS Vitals: 127/89 97% 3L [place] 83 HR CBG 97

## 2024-02-09 NOTE — ED Provider Notes (Signed)
 [facility] Provider Note    Event Date/Time   First MD Initiated Contact with Patient 02/09/24 1350     (approximate)   History   Alcohol Intoxication   HPI Kaitlin Phillips is a 38 y.o. female with history of alcohol abuse disorder presenting today for alcohol intoxication.  Patient was just here in the ED last night for severe alcohol intoxication before eventual discharge.  This morning the family thought she was acting abnormal and called EMS again.  She denies any additional alcohol since yesterday but family reports that she is frequently sneaking alcohol.  She noted some numbness to her left hand which is reportedly new for her.  Denies any numbness or tingling elsewhere.  Also endorsing nausea.  Otherwise denies any chest pain, shortness of breath, abdominal pain, diarrhea, constipation.  Reviewed ED notes from last night.     Physical Exam   Triage Vital Signs: ED Triage Vitals  Encounter Vitals Group     BP 02/09/24 1357 128/87     Systolic BP Percentile --      Diastolic BP Percentile --      Pulse Rate 02/09/24 1357 68     Resp 02/09/24 1357 12     Temp 02/09/24 1357 98.3 F (36.8 C)     Temp Source 02/09/24 1357 Oral     SpO2 02/09/24 1354 91 %     Weight 02/09/24 1355 140 lb (63.5 kg)     Height 02/09/24 1355 5\' 5"  (1.651 m)     Head Circumference --      Peak Flow --      Pain Score 02/09/24 1354 0     Pain Loc --      Pain Education --      Exclude from Growth Chart --     Most recent vital signs: Vitals:   02/09/24 1354 02/09/24 1357  BP:  128/87  Pulse:  68  Resp:  12  Temp:  98.3 F (36.8 C)  SpO2: 91% 92%   Physical Exam: I have reviewed the vital signs and nursing notes. General: Awake, alert, noticeably intoxicated. Head:  Atraumatic, normocephalic.   ENT:  EOM intact, PERRL. Oral mucosa is pink and moist with no lesions. Neck: Neck is supple with full range of motion, No meningeal signs. Cardiovascular:  RRR,  No murmurs. Peripheral pulses palpable and equal bilaterally. Respiratory:  Symmetrical chest wall expansion.  No rhonchi, rales, or wheezes.  Good air movement throughout.  No use of accessory muscles.   Musculoskeletal:  No cyanosis or edema. Moving extremities with full ROM Abdomen:  Soft, nontender, nondistended. Neuro:  GCS 15, moving all four extremities, interacting appropriately. Speech clear.  Reports numbness to left hand but when palpated she can feel it without issue.  Otherwise sensation equal and intact throughout bilateral upper and lower extremities.  Strength equal and intact throughout bilateral upper and lower extremities.  Noticeably intoxicated. Psych:  Calm, appropriate.   Skin:  Warm, dry, no rash.    ED Results / Procedures / Treatments   Labs (all labs ordered are listed, but only abnormal results are displayed) Labs Reviewed  CBC WITH DIFFERENTIAL/PLATELET - Abnormal; Notable for the following components:      Result Value   MCV 102.0 (*)    MCH 35.3 (*)    Platelets 122 (*)    All other components within normal limits  URINALYSIS, ROUTINE W REFLEX MICROSCOPIC - Abnormal; Notable for the following components:  Color, Urine COLORLESS (*)    APPearance CLEAR (*)    Specific Gravity, Urine 1.003 (*)    Hgb urine dipstick SMALL (*)    Bacteria, UA RARE (*)    All other components within normal limits  URINE DRUG SCREEN, QUALITATIVE ([facility] ONLY) - Abnormal; Notable for the following components:   Benzodiazepine, Ur Scrn POSITIVE (*)    All other components within normal limits  BASIC METABOLIC PANEL  LIPASE, BLOOD  ETHANOL     EKG My EKG interpretation: Rate of 81, normal sinus rhythm, normal axis, normal intervals.  No acute ST elevations or depressions   RADIOLOGY CT head pending at time of signout   PROCEDURES:  Critical Care performed: No  Procedures   MEDICATIONS ORDERED IN ED: Medications  ondansetron (ZOFRAN) injection 4 mg (4 mg  Intravenous Given 02/09/24 1424)     IMPRESSION / MDM / ASSESSMENT AND PLAN / ED COURSE  I reviewed the triage vital signs and the nursing notes.                              Differential diagnosis includes, but is not limited to, acute alcohol intoxication, polysubstance abuse, electrolyte abnormality, dehydration  Patient's presentation is most consistent with exacerbation of chronic illness.  Patient is a 38 year old female with history of alcohol abuse disorder presenting today noticeably intoxicated.  She complains of numbness to the left hand but when this is challenged on exam, she states that she can feel it..  No numbness symptoms elsewhere.  Suspect this may be related to her intoxication but will get CT head to rule out.  She has not with any window for tPA and no evidence of LVO to require stroke alert.  Laboratory workup with normal CBC, BMP, lipase, UA.  She was given benzodiazepines yesterday and that is the only thing in her UDS.  Patient was signed out to oncoming provider pending results of CT and metabolization for reassessment.  The patient is on the cardiac monitor to evaluate for evidence of arrhythmia and/or significant heart rate changes.     FINAL CLINICAL IMPRESSION(S) / ED DIAGNOSES   Final diagnoses:  Alcoholic intoxication without complication (HCC)     Rx / DC Orders   ED Discharge Orders     None        Note:  This document was prepared using Dragon voice recognition software and may include unintentional dictation errors.   Janith Lima, MD 02/09/24 1501

## 2024-02-09 NOTE — ED Notes (Signed)
 PO challenge initiated

## 2024-02-09 NOTE — ED Notes (Signed)
 PO challenged completed and successful. MD aware.

## 2024-02-09 NOTE — ED Notes (Signed)
 Pt asking this RN to call husband for change of clothes and to call mother. Both called and updated.

## 2024-02-09 NOTE — ED Provider Notes (Signed)
 Procedures  Clinical Course as of 02/09/24 0228  Mon Feb 08, 2024  2232 Alcohol, Ethyl (B)(!!): 313 Elevated  [TT]  2315 DG Chest Port 1 View Hypoventilatory chest.  [TT]  2315 Independent review of labs, lipase is mildly elevated, electrolytes not severely deranged, no leukocytosis, UA not consistent with UTI, magnesium, phosphate levels are normal, beta quant is negative, troponin and BNP are not elevated. [TT]    Clinical Course User Index [TT] Claybon Jabs, MD    ----------------------------------------- 2:28 AM on 02/09/2024 -----------------------------------------  Clinically sober. Requresting ativan rx which I will defer for now given pt's ongoing issues with alcohol abuse and untruthfulness about her drinking. Stable for discharge.    Sharman Cheek, MD 02/09/24 [phone]

## 2024-02-09 NOTE — ED Notes (Signed)
 Pt urinated on bed, pt cleaned and changed into blue scrubs, sheet changed, new chuck pad placed.

## 2024-02-09 NOTE — ED Notes (Addendum)
 Writer called pt's spouse at this time to transport pt home at discharge at the request of pt.

## 2024-08-30 ENCOUNTER — Telehealth: Payer: Self-pay | Admitting: Family Medicine

## 2024-08-30 ENCOUNTER — Other Ambulatory Visit: Payer: Self-pay

## 2024-08-30 DIAGNOSIS — F39 Unspecified mood [affective] disorder: Secondary | ICD-10-CM

## 2024-08-30 DIAGNOSIS — F1411 Cocaine abuse, in remission: Secondary | ICD-10-CM

## 2024-08-30 DIAGNOSIS — F1020 Alcohol dependence, uncomplicated: Secondary | ICD-10-CM

## 2024-08-30 NOTE — Telephone Encounter (Signed)
 LOV- 06/18/2023 NOV- None LRF- Buspirone  06/18/2023 270 x 3 by Kelly Cedar Gabapentin  08/19/2023 270 x 3 by Kelly Cedar end date of 08/18/2024

## 2024-08-30 NOTE — Telephone Encounter (Signed)
 [facility] faxed refill request for the following medications:  busPIRone (BUSPAR) 15 MG tablet   Please advise.

## 2024-08-30 NOTE — Telephone Encounter (Signed)
 Converted into medication refill and sent to Dr. KATHEE.

## 2024-08-30 NOTE — Telephone Encounter (Signed)
[facility] faxed refill request for the following medications:  gabapentin (NEURONTIN) 300 MG capsule   Please advise. 

## 2024-09-16 ENCOUNTER — Ambulatory Visit: Payer: Self-pay

## 2024-09-16 NOTE — Telephone Encounter (Signed)
 FYI Only or Action Required?: Action required by provider: update on patient condition and seizure medication request/refill rx at ED.  Patient was last seen in primary care on 12/08/2023 by Emilio Kelly DASEN, FNP.  Called Nurse Triage reporting Alcohol Problem.  Symptoms began several years ago.  Interventions attempted: Prescription medications: as prescribed.  Symptoms are: gradually worsening.  Triage Disposition: See PCP When Office is Open (Within 3 Days) (overriding Call PCP Within 24 Hours)  Patient/caregiver understands and will follow disposition?: Unsure    Copied from CRM [id number]. Topic: Clinical - Red Word Triage >> Sep 16, 2024  9:13 AM Delon HERO wrote: Red Word that prompted transfer to Nurse Triage: Patient is calling to report that she is having hard time- turning to towards alcohol. Almost running out of phenobarbital 32.4mg  2 times daily every 8 hours Given in [facility] emergency room.  Going through a divorce - rough ride.  Went to rehab over a year half a go. Reason for Disposition  Requesting to talk with a counselor (mental health worker, psychiatrist, etc.)  Answer Assessment - Initial Assessment Questions Additional info: 1) Patient has a history of withdrawal seizure, she is currently on phenobarbital 32.4mg  and is requesting refill as she only has one day left of prescription. [facility] [address]. She is struggling with relapse and does not want to turn to alcohol to prevent seizure. Scheduled next available hospital follow up on 09/19/24. Patient aware appointment is needed for new medication prescription, she is requesting work in hospital follow up today if unable to send enough until appointment on Monday.  2) Patient calling in from [facility] where she went for evaluation this morning.  3) She is interested in therapy referral.     1)  ALCOHOL USE: Do you drink alcohol, including beer, wine or hard liquor?     Trying to refrain.   2. HOW OFTEN: How many days per week do you typically drink alcohol?     Varies, struggling with relapse 3. HOW MUCH: How many drinks do you typically have on days when you drink? (1.5 oz hard liquor [one shot or jigger; 45 ml], 5 oz wine [small glass; 150 ml], 12 oz beer [one can; 360 ml])     1-2 to keep off withdrawals 4. MOST: What is the most that you have had to drink on any one occasion in the last month?      5. LAST 24 HOURS: Have you had a drink within the last 24 hours?     2 6. DRINKING PROBLEM: Do you have or have you ever had an alcohol drinking problem?     yes 7. DRUG PROBLEM: Are you using any other drugs? (e.g., yes/no; cocaine, prescription medicines, etc.)     no 8. SYMPTOMS: What symptoms are you currently experiencing? (e.g., none, tremors, or shakiness, abdomen pain, vomiting, blackout spells)     Intermittent tremors  9. TREATMENT PROGRAM: Have you ever gone through an alcohol use treatment program?     1.5 years ago. Relapsed a few months ago during divorce 10. THERAPIST: Do you have a counselor or therapist? If Yes, ask: What is their name?       No. Currently calling from [facility] health for evaluation 11. SUPPORT: Who is with you now? Who do you live with? Do you have family or friends who you can talk to? Are you a member of Alcoholics Anonymous?       Currently calling from [facility] mental health  where she went for evaluation.  12. PREGNANCY: Is there any chance you are pregnant? When was your last menstrual period?  Protocols used: Alcohol Use and Problems-A-AH

## 2024-09-19 ENCOUNTER — Ambulatory Visit (INDEPENDENT_AMBULATORY_CARE_PROVIDER_SITE_OTHER): Admitting: Physician Assistant

## 2024-09-19 ENCOUNTER — Encounter: Payer: Self-pay | Admitting: Physician Assistant

## 2024-09-19 VITALS — BP 124/81 | HR 90 | Resp 16 | Ht 65.0 in | Wt 139.5 lb

## 2024-09-19 DIAGNOSIS — F39 Unspecified mood [affective] disorder: Secondary | ICD-10-CM | POA: Diagnosis not present

## 2024-09-19 DIAGNOSIS — F1020 Alcohol dependence, uncomplicated: Secondary | ICD-10-CM | POA: Diagnosis not present

## 2024-09-19 DIAGNOSIS — G40909 Epilepsy, unspecified, not intractable, without status epilepticus: Secondary | ICD-10-CM

## 2024-09-19 DIAGNOSIS — F1411 Cocaine abuse, in remission: Secondary | ICD-10-CM | POA: Diagnosis not present

## 2024-09-19 DIAGNOSIS — F5102 Adjustment insomnia: Secondary | ICD-10-CM

## 2024-09-19 NOTE — Telephone Encounter (Signed)
 Patient has an appointment today @ 2:00 pm with another provider.
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
## 2024-09-20 NOTE — Progress Notes (Signed)
 Integrated Behavioral Health Initial Visit--WARM HAND OFF  MRN: [id number] Name: Kyandra Mcclaine [facility] Number of Integrated Behavioral Health Clinician visits: Warm hand off--scheduled IBH session in 2 weeks Session Start time: 1450  Session End time: 1530 Total time: 40     Warm Hand Off Completed.       Patient and/or legal guardian verbally consented to meet with [facility] Consultant about presenting concerns.Pt provided verbal consent for her mother to be present during assessment/treatment planning. Review of patient's consultants notes from appropriate care team members was performed as part of care coordination referral  SUBJECTIVE: KEMIAH BOOZ is a 38 y.o. year old female who sees [place], Janna, PA-C for primary care.  Referred by Janna Ostwalt PA-C for [facility] Collaborative care  Report of symptoms: Mood disorder, substance use  ASSESSMENT: Mood: Depressed and Affect: Labile and Tearful Patient is currently experiencing symptoms of  sadness and anxiety which are exacerbated by ongoing alcohol use. .  Patient may benefit from and is in agreement to receive further assessment and brief therapeutic interventions to assist with managing alcohol dependence.[name]   GOALS: Reduce symptoms of: depression and substance use/dependence Increase knowledge and/or ability of: coping skills, healthy habits, self-management skills, and stress reduction  Demonstrate ability to: Increase healthy adjustment to current life circumstances and Decrease self-medicating behaviors  Plan:  Pts mother reports that she will take [facility] to [place] for medical detox from alcohol. 2.   Follow up with [facility] Collaborative care management to determine future referrals/supports. Clinician scheduled appt in 2 weeks. Pt may need residential treatment or Substance Use Intensive Outpatient Treatment.  Pt and her mother both reflect understanding of immediate need to seek medical detox. Provided pt with local  behavioral health crisis information.

## 2024-09-30 ENCOUNTER — Ambulatory Visit (INDEPENDENT_AMBULATORY_CARE_PROVIDER_SITE_OTHER): Admitting: Licensed Clinical Social Worker

## 2024-09-30 NOTE — BH Specialist Note (Signed)
 Pt did not show for virtual visit. Clinician reached out to pt via telephone and pt reports she was unaware of today's appt because her mom scheduled it when she was in the office last at [facility]  Provided pt opportunity to reschedule at a time that would be convenient for her. Appt rescheduled to 10/07/24.

## 2024-10-07 ENCOUNTER — Ambulatory Visit (INDEPENDENT_AMBULATORY_CARE_PROVIDER_SITE_OTHER): Admitting: Licensed Clinical Social Worker

## 2024-10-07 DIAGNOSIS — F4323 Adjustment disorder with mixed anxiety and depressed mood: Secondary | ICD-10-CM
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
## 2024-10-07 NOTE — Patient Instructions (Signed)
 Using Behavioral Activation to manage stress/depression symptoms    Identify/understand your own mood triggers.   Structure your day--get up around the same time, eat meals/snacks around the same time, go to bed around the same time.   Purposefully schedule self care time and time to complete tasks. This can include quiet time  Stimulate your brain--go for a walk, text/call a friend or family member, if you are indoors--go outside (and vice versa), go for a drive, go to a store with bright colors and bright lights. Try to do things in a different way--drive to your favorite places using an alternative route, or instead of starting on the right side of the grocery store when shopping, start on the left side. You might feel a bit uncomfortable doing things outside of the comfort zone, but this is helping the brain create new neural pathways and is very healthy for brain/emotional health.   Physical movement based on your ability. If you can go for a walk, do stretches, even waving your hands to music can trigger feel-good endorphins in the brain and help release physical tension we all hold in our bodies.  Even 5 minutes can make a difference.   Be intentional about doing things that bring you joy (or used to bring you joy), and look for the things in every day that make you happy.  Seek those glimmers of joy each day.  Set a timer for 5 minutes for a harder task (ex. Laundry, washing dishes).  Allow yourself to work distraction-free for 5 minutes, then stop when the timer goes off. If you need a break, take a break. If you want to continue working then set another timer for whatever time you choose.   Limit or eliminate substance use including alcohol, marijuana, or recreational use of prescription medication.  Let in the light!! Open the window blinds, curtains and let natural light in. Even sitting near a window or sitting outside can boost your mood, especially in the wintertime when there  is less daylight.    Things to envision for ourselves to to improve inspiration, motivation, and initiative :  improving physical wellness, focus on family relationships, focusing on our own mental/emotional well being, being a part of a bigger community, finding a hobby, being a part of something that fosters personal growth, engaging socially with others (even digitally!!!)    ANXIETY/PANIC EPISODE MANAGEMENT (CBT/MINDFULNESS BASED)   If you are in a highly stimulating or triggering environment, change your location to a less stimulating or safer environment .   Stimulate your senses by tasting/eating a sour candy (such as a lemon drop) or a strong flavored cough drop.  This triggers smell, taste, and touch since we  have had lots of nerve endings inside of your mouth.   Practice slow, controlled breathing to avoid hyperventilation.  Breathing into your nose for the count of 4 inside your head, holding your breath for the count of 4 inside your head, and exhale slowly counting to 8 inside your head.  This is called 4-4-8 breathing, or triangle breathing. (Controlled breathing). This helps manage panic, anxiety, anger, and tearfulness.  Additional grounding exercises include rubbing your hands softly together, or wiggling toes inside your shoes, pretending to grasp the floor with your toes.    Listen to calming music or sounds  Count backwards in your head by twos or tens or recite multiplication tables in your head.  Visualize a calming, happy place and identify 5 explicit details about this  place (color, temperature, smells, visual details, etc.)  Splash cold water on your face or hold an ice cube in your hand (alternate hands)  Clench and release muscle groups (hands, shoulders, facial muscles)  Engage in soothing activities to recover after a stressful/anxious episode such as: drinking a warm beverage, sitting in your favorite comfortable location, positive physical contact with  a pet or weighted blanket, using positive self talk/positive affirmations.   Download PTSD Coach app for your phone or tablet--its free and has lots of tips to help you manage panic episodes no matter where you are!      Emergency Resources:  National Suicide & Crisis Lifeline: Call or text 988  Crisis Text Line: Text HOME to 810-732-7215  [facility]  [address], [place], [place] [address] [phone] or [phone] [facility] 24/7 FOR ANYONE [address], [place], [place]  [phone] Fax: [phone] [email or web address] *Interpreters available *Accepts all insurance and uninsured for Urgent Care needs *Accepts Medicaid and uninsured for outpatient treatment (below)

## 2024-10-12 ENCOUNTER — Telehealth (INDEPENDENT_AMBULATORY_CARE_PROVIDER_SITE_OTHER): Payer: Self-pay | Admitting: Licensed Clinical Social Worker

## 2024-10-12 DIAGNOSIS — F4323 Adjustment disorder with mixed anxiety and depressed mood: Secondary | ICD-10-CM

## 2024-10-12 NOTE — BH Specialist Note (Signed)
 Attestation signed by Warren Becker, PMHNP, DNP 10/12/2024 8:25 AM   Collaborative Care Psychiatric Consultant Case Review   Assessment/Provisional Diagnosis 38 year old female with history of tobacco, cocaine, and alcohol abuse. The patient is referred for anxiety, alcohol use, and depression.   Provisional Diagnosis: # MDD, recurrent, mild   Recommendation 1. Continue vitamin D  and Campral 333 mg TID for alcohol use d/o 2. Continue with [facility] for substance use d/o 3. BH specialist to follow up.   Thank you for your consult. Please contact our collaborative care team for any questions or concerns.   I spent 20 minutes chart reviewing, discussing with [facility] Speicalist and documenting in the chart.   The above treatment considerations and suggestions are based on consultation with the [facility] specialist and/or PCP and a review of information available in the shared registry and the patient's Electronic Health Record (EHR). I have not personally examined the patient. All recommendations should be implemented with consideration of the patient's relevant prior history and current clinical status. Please feel free to call me with any questions about the care of this patient.    Virtual Behavioral Health Treatment Plan Team Note  MRN: [id number] NAME: Kaitlin Phillips  DATE: 10/12/24  Start time: Start Time: 0845 End time: Stop Time: 0900 Total time: Total Time in Minutes (Visit): 15  Total number of Virtual BH Treatment Team Plan encounters: 1/4  Treatment Team Attendees: Terrionna Bridwell, LCSW and Sharlot Becker, DNP   Diagnoses:    ICD-10-CM   1. Adjustment disorder with mixed anxiety and depressed mood  F43.23       Goals, Interventions and Follow-up Plan Goals: Increase healthy adjustment to current life circumstances Decrease self-medicating behaviors Interventions: Motivational Interviewing Medication Monitoring Supportive Counseling  Medication Management Recommendations:  Continue vitamin D  and Campral 333 mg TID for alcohol use d/o  Follow-up Plan: Pt to continue services through [facility] program. [facility] Collaborative Care check ins monthly  History of the present illness Presenting Problem/Current Symptoms: Kaitlin Phillips is a 38 y.o. female with history of anxiety, depression, and alcohol use disorder seen in consultation at the request of Janna Ostwalt PA C for establishment of [facility] Collaborative Care management.   Pt is currently taking the following psychiatric medications: lexapro 10, hydroxyzine  25, and acamprosate 333 , gabapentin  300 mg  Current symptoms include: depression, anxiety, ongoing sobriety at time of session (14 days sober).  Pt denies SI, HI, or AVH at time of session. Pt denies substance use.   Psychiatric History   Have you ever been treated for a mental health problem? Yes If Yes, when were you treated and whom did you see (psychiatrist/counselor) ?     When on and off through the years       Name of provider Pt reports that she has been part of the [facility] program through [facility] more than one time, and that she has seen counselors in the past after triggering life events    Psychiatric History  Depression: Yes Anxiety: Yes Mania: No Psychosis: No PTSD symptoms: Yes (depression, anxiety, substance use)   Past Psychiatric History/Hospitalization(s): Hospitalization for psychiatric illness: Yes--detox/substance use treatment Prior Suicide Attempts: No Prior Self-injurious behavior: No   Have you ever had thoughts of harming yourself or others or attempted suicide? No plan to harm self or others  Psychosocial stressors Flowsheet Row Integrated Behavioral Health from 10/07/2024 in [facility]  Current Stressors Divorce, Family conflict, Finances, Hospitalization, Legal issues,  Separation, Other (Comment)  [Loss of child custody, in active recovery]  Familial Stressors None  Sleep Decreased, Difficulty falling  asleep, Difficulty staying asleep, Frequent awakening  Appetite Decreased, Increased  [fluctuates]  Coping ability Overwhelmed  Patient taking medications as prescribed Yes    Self-harm Behaviors Risk Assessment Flowsheet Row Integrated Behavioral Health from 10/07/2024 in [facility]  Self-harm risk factors Family or marital conflict, Recent discharge from inpatient psychiatric facility, Substance use disorder, Unemployment  Have you recently had any thoughts about harming yourself? No    Screenings PHQ-9 Assessments:     10/07/2024    9:18 AM 09/19/2024    2:18 PM 12/08/2023    9:55 AM  Depression screen PHQ 2/9  Decreased Interest 1 2 2   Down, Depressed, Hopeless 1 2 1   PHQ - 2 Score 2 4 3   Altered sleeping 1 3 3   Tired, decreased energy 3 2 1   Change in appetite 1 3 0  Feeling bad or failure about yourself  0 3 0  Trouble concentrating 1 3 1   Moving slowly or fidgety/restless 1 1 2   Suicidal thoughts 0 0 0  PHQ-9 Score 9 19 10   Difficult doing work/chores  Very difficult Somewhat difficult   GAD-7 Assessments:     10/07/2024    9:23 AM 09/19/2024    2:19 PM 12/08/2023    9:55 AM 10/20/2023    1:15 PM  GAD 7 : Generalized Anxiety Score  Nervous, Anxious, on Edge 1 2 1 1   Control/stop worrying 3 3 2 2   Worry too much - different things 3 3 2 3   Trouble relaxing 1 3 2 2   Restless 1 3 2 1   Easily annoyed or irritable 1 3 2 2   Afraid - awful might happen 0 3 0 0  Total GAD 7 Score 10 20 11 11   Anxiety Difficulty Somewhat difficult Extremely difficult Somewhat difficult     Past Medical History 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  Allergies:   Allergies as of 10/12/2024 - Review Complete 09/19/2024  Allergen Reaction Noted   Amitriptyline  Anaphylaxis 02/11/2016   Amoxicillin Anaphylaxis 01/06/2017   Monosodium glutamate Other (See Comments) 02/08/2024   Penicillins Hives, Shortness Of Breath, and Swelling 11/19/2015   Dilantin  [phenytoin sodium extended] Itching 11/19/2015   Latex  05/04/2023   Penicillin g  05/04/2023   Vicodin  [hydrocodone -acetaminophen ] Nausea Only and Nausea And Vomiting 11/19/2015    Medication History Current medications:  Outpatient Encounter Medications as of 10/12/2024  Medication Sig   B Complex-C (B-COMPLEX WITH VITAMIN C) tablet Take 1 tablet by mouth daily.   gabapentin  (NEURONTIN ) 300 MG capsule Take 1 capsule (300 mg total) by mouth 3 (three) times daily.   levonorgestrel  (MIRENA , 52 MG,) 20 MCG/DAY IUD 1 each by Intrauterine route once.   PHENobarbital (LUMINAL) 32.4 MG tablet Take 32.4 mg by mouth as needed. Q8h prn   thiamine  (VITAMIN B1) 100 MG tablet Take 100 mg by mouth daily.   VITAMIN D , CHOLECALCIFEROL, PO Take 10 mcg by mouth daily.   No facility-administered encounter medications on file as  of 10/12/2024.     Scribe for Treatment Team: Neasia Fleeman R Okla Qazi, LCSW

## 2024-10-24 ENCOUNTER — Telehealth: Payer: Self-pay | Admitting: Physician Assistant

## 2024-10-24 NOTE — Telephone Encounter (Signed)
[facility] faxed refill request for the following medications:  gabapentin (NEURONTIN) 300 MG capsule   Please advise. 

## 2024-10-25 ENCOUNTER — Other Ambulatory Visit: Payer: Self-pay

## 2024-10-25 DIAGNOSIS — F1411 Cocaine abuse, in remission: Secondary | ICD-10-CM

## 2024-10-25 DIAGNOSIS — F39 Unspecified mood [affective] disorder: Secondary | ICD-10-CM

## 2024-10-25 DIAGNOSIS — F1020 Alcohol dependence, uncomplicated: Secondary | ICD-10-CM

## 2024-10-25 NOTE — Telephone Encounter (Signed)
 LOV 09/19/24 NOV none LRF 08/19/23 270 x 3

## 2024-10-28 ENCOUNTER — Ambulatory Visit (INDEPENDENT_AMBULATORY_CARE_PROVIDER_SITE_OTHER): Payer: Self-pay | Admitting: Family Medicine

## 2024-10-28 ENCOUNTER — Encounter: Payer: Self-pay | Admitting: Family Medicine

## 2024-10-28 VITALS — BP 157/99 | HR 99 | Ht 65.0 in | Wt 149.6 lb

## 2024-10-28 DIAGNOSIS — F1020 Alcohol dependence, uncomplicated: Secondary | ICD-10-CM

## 2024-10-28 DIAGNOSIS — F419 Anxiety disorder, unspecified: Secondary | ICD-10-CM

## 2024-10-28 DIAGNOSIS — F1411 Cocaine abuse, in remission: Secondary | ICD-10-CM

## 2024-10-28 DIAGNOSIS — F39 Unspecified mood [affective] disorder: Secondary | ICD-10-CM

## 2024-10-28 DIAGNOSIS — R030 Elevated blood-pressure reading, without diagnosis of hypertension: Secondary | ICD-10-CM

## 2024-10-28 DIAGNOSIS — G40909 Epilepsy, unspecified, not intractable, without status epilepticus: Secondary | ICD-10-CM

## 2024-10-28 MED ORDER — THIAMINE MONONITRATE 100 MG PO TABS: 100.0000 mg | ORAL_TABLET | Freq: Every day | ORAL | 1 refills | Status: AC

## 2024-10-28 MED ORDER — B COMPLEX-C PO TABS: 1.0000 | ORAL_TABLET | Freq: Every day | ORAL | 1 refills | Status: AC

## 2024-10-28 MED ORDER — HYDROXYZINE HCL 25 MG PO TABS: 25.0000 mg | ORAL_TABLET | Freq: Three times a day (TID) | ORAL | 0 refills | Status: DC

## 2024-10-28 MED ORDER — ACAMPROSATE CALCIUM 333 MG PO TBEC: 666.0000 mg | DELAYED_RELEASE_TABLET | Freq: Three times a day (TID) | ORAL | 0 refills | Status: DC

## 2024-10-28 MED ORDER — GABAPENTIN 300 MG PO CAPS: 300.0000 mg | ORAL_CAPSULE | Freq: Three times a day (TID) | ORAL | 0 refills | Status: DC

## 2024-10-28 MED ORDER — VITAMIN D (CHOLECALCIFEROL) 10 MCG (400 UNIT) PO CAPS: 10.0000 ug | ORAL_CAPSULE | Freq: Every day | ORAL | 1 refills | Status: AC

## 2024-10-28 MED ORDER — ESCITALOPRAM OXALATE 10 MG PO TABS: 10.0000 mg | ORAL_TABLET | Freq: Every day | ORAL | 0 refills | Status: DC
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
## 2024-11-11 ENCOUNTER — Ambulatory Visit: Payer: Self-pay | Admitting: Licensed Clinical Social Worker

## 2024-11-11 DIAGNOSIS — F4323 Adjustment disorder with mixed anxiety and depressed mood: Secondary | ICD-10-CM

## 2024-11-11 DIAGNOSIS — F1020 Alcohol dependence, uncomplicated: Secondary | ICD-10-CM

## 2024-11-11 NOTE — Patient Instructions (Signed)
 Using Behavioral Activation to manage stress/depression symptoms     Identify/understand your own mood triggers.   Structure your day--get up around the same time, eat meals/snacks around the same time, go to bed around the same time.   Purposefully schedule self care time and time to complete tasks. This can include quiet time.  Stimulate your brain--go for a walk, text/call a friend or family member, if you are indoors--go outside (and vice versa), go for a drive, go to a store with bright colors and bright lights. Try to do things in a different way--drive to your favorite places using an alternative route, or instead of starting on the right side of the grocery store when shopping, start on the left side. You might feel a bit uncomfortable doing things outside of the comfort zone, but this is helping the brain create new neural pathways and is very healthy for brain/emotional health.   Physical movement based on your ability. If you can go for a walk, do stretches, even waving your hands to music can trigger feel-good endorphins in the brain and help release physical tension we all hold in our bodies.  Even 5 minutes can make a difference.   Be intentional about doing things that bring you joy (or used to bring you joy), and look for the things in every day that make you happy.  Seek those glimmers of joy each day.  Set a timer for 5 minutes for a harder task (ex. Laundry, washing dishes).  Allow yourself to work distraction-free for 5 minutes, then stop when the timer goes off. If you need a break, take a break. If you want to continue working then set another timer for whatever time you choose.   Limit or eliminate substance use including alcohol, marijuana, or recreational use of prescription medication.  Let in the light!! Open the window blinds, curtains and let natural light in. Even sitting near a window or sitting outside can boost your mood, especially in the wintertime when  there is less daylight.   If you take medication to manage symptoms, remember to take all medications as they are prescribed (please read all labels!!)    Things to envision for ourselves to to improve inspiration, motivation, and initiative :  improving physical wellness, focus on family relationships, focusing on our own mental/emotional well being, being a part of a bigger community, finding a hobby, being a part of something that fosters personal growth, engaging socially with others (even digitally!!!)    Emergency Resources:  National Suicide & Crisis Lifeline: Call or text 988  Crisis Text Line: Text HOME to 478-823-2979  [facility]  [address], [place], [place] [address] [phone] or [phone] WALK-IN URGENT CARE 24/7 FOR ANYONE [address], [place], [place]  [phone] Fax: [phone] [email or web address] *Interpreters available *Accepts all insurance and uninsured for Urgent Care needs *Accepts Medicaid and uninsured for outpatient treatment (below)

## 2024-11-11 NOTE — BH Specialist Note (Signed)
 Virtual Visit via Audio Note  I connected with Kaitlin Phillips on 11/11/24 at 11:00 AM EST by an audio enabled telemedicine application and verified that I am speaking with the correct person using two identifiers.  Location: Patient: Primary residence, [place] Provider: Clinician virtual office, [place]   I discussed the limitations of evaluation and management by telemedicine and the availability of in person appointments. The patient expressed understanding and agreed to proceed.   I discussed the assessment and treatment plan with the patient. The patient was provided an opportunity to ask questions and all were answered. The patient agreed with the plan and demonstrated an understanding of the instructions.     Integrated Behavioral Health Follow Up Visit  MRN: [id number] Name: Kaitlin Phillips [facility]  Number of Integrated Behavioral Health Clinician visits: 3- Third Visit  Session Start time: 1100   Session End time: 1120  Total time in minutes: 20  Encounter Diagnoses  Name Primary?   Adjustment disorder with mixed anxiety and depressed mood Yes   Alcohol use disorder, severe, dependence (HCC)      Types of Service: Telephone visit and Collaborative care   Subjective: Kaitlin Phillips is a 38 y.o. female with history of anxiety, depression, and alcohol use disorder seen in consultation at the request of Janna Ostwalt PA C for establishment of [facility] Collaborative Care management. Pt is currently taking the following psychiatric medications: lexapro  10, hydroxyzine  25, and acamprosate  333 , gabapentin  300 mg Current symptoms include: depression, anxiety, ongoing sobriety at time of session (14 days sober). Pt denies SI, HI, or AVH at time of session.  Patient reports that she is still drinking alcohol  I have setbacks every now and then but is trying hard to cut back.  Patient denies any additional substance use  Patient reports the following symptoms/concerns: Substance use,  depression Duration of problem: Ongoing; Severity of problem: moderate  Objective: Mood: Depressed and Affect: Depressed Risk of harm to self or others: No plan to harm self or others  Life Context: Family and Social: Patient reports ongoing family conflict associated with custody of her children.  Patient reports difficult relationship with her ex husband, who has been complicating her visits with children.  Patient has significant ongoing legal concerns--custody, visitation, DUI.  School/Work: Patient reports that she is not currently working or attending school at time of session  Self-Care: Patient has been compliant with treatment recommendations including [facility].  Patient reports she is also attending AA groups  Life Changes: Patient reports that she has a new female friend in her life, which she feels is a very positive change.  Patient reports that she feels that he is very supportive and wants to see her make positive changes in her life  Patient and/or Family's Strengths/Protective Factors: Social and Emotional competence and Concrete supports in place (healthy food, safe environments, etc.)  Goals Addressed: Patient will:  Reduce symptoms of: depression and stress   Increase knowledge and/or ability of: coping skills, healthy habits, self-management skills, and stress reduction   Demonstrate ability to: Increase healthy adjustment to current life circumstances and Decrease self-medicating behaviors  Progress towards Goals: Ongoing  Interventions: Interventions utilized:  Motivational Interviewing, Medication Monitoring, and Supportive Counseling Standardized Assessments completed: C-SSRS Short, GAD-7, and PHQ 9     11/11/2024   11:04 AM 10/28/2024   11:17 AM 10/07/2024    9:18 AM  Depression screen PHQ 2/9  Decreased Interest 3 1 1   Down,  Depressed, Hopeless 3 2 1   PHQ - 2 Score 6 3 2   Altered sleeping 3 3 1   Tired, decreased energy 1 1 3    Change in appetite 3 3 1   Feeling bad or failure about yourself  3 3 0  Trouble concentrating 1 2 1   Moving slowly or fidgety/restless 3 2 1   Suicidal thoughts 0 0 0  PHQ-9 Score 20 17 9    Difficult doing work/chores  Very difficult      Data saved with a previous flowsheet row definition      11/11/2024   11:06 AM 10/28/2024   11:17 AM 10/07/2024    9:23 AM 09/19/2024    2:19 PM  GAD 7 : Generalized Anxiety Score  Nervous, Anxious, on Edge 3 2 1 2   Control/stop worrying 3 3 3 3   Worry too much - different things 3 3 3 3   Trouble relaxing 3 3 1 3   Restless 3 3 1 3   Easily annoyed or irritable 3 3 1 3   Afraid - awful might happen 1 3 0 3  Total GAD 7 Score 19 20 10 20   Anxiety Difficulty  Extremely difficult Somewhat difficult Extremely difficult    Flowsheet Row ED from 02/09/2024 in [facility] Emergency Department at [facility] ED from 02/08/2024 in [facility] Emergency Department at [facility] ED from 07/20/2023 in [facility] Emergency Department at [facility]  C-SSRS RISK CATEGORY No Risk No Risk No Risk     Patient and/or Family Response: Patient reports that it is very hard for her to deal with her stressors, but she is doing the best that she can.  Patient reports that she feels that she is well supported by her mother and boyfriend.  Patient Centered Plan: Patient is on the following Treatment Plan(s):    Continue vitamin D  and Campral  333 mg TID for alcohol use d/o 2. Continue with [facility] for substance use d/o 3. BH specialist to follow up.  Clinical Assessment/Diagnosis  Adjustment disorder with mixed anxiety and depressed mood  Alcohol use disorder, severe, dependence (HCC)    Assessment: Patient currently experiencing depression, low mood,.  Insomnia, feeling bad about self, feeling down/depressed/hopeless, feeling nervous/anxious/on the edge, worrying a lot about different things, restlessness, increased irritability.  Patient reports  that she is still having days where she drinks alcohol.  Patient may benefit from: Continuing intensive outpatient treatment through [facility], continuing to attend AA meetings, and I[facility] follow-up.  Plan: Follow up with behavioral health clinician on : 12/16/24 Behavioral recommendations:  Prioritize medication compliance Prioritize substance abstinence Attend all intensive outpatient groups and AA groups Focus on self-care/behavioral activation Referral(s): [facility] [employer] (In Clinic)  Carson R Henrry Feil, LCSW

## 2024-11-21 ENCOUNTER — Telehealth: Payer: Self-pay | Admitting: Physician Assistant

## 2024-11-21 NOTE — Telephone Encounter (Signed)
 Refill Request   Pharmacy: [facility]  Medication: gabapentin  (NEURONTIN ) 300 MG capsule [[id number]]   Quantity (if provided): 270  Please send in refill request.

## 2024-11-21 NOTE — Telephone Encounter (Signed)
 Refuse, pt was sent a 90 day supply medication 14/21/25. Pt should have enough meds

## 2024-11-23 ENCOUNTER — Other Ambulatory Visit: Payer: Self-pay

## 2024-11-23 ENCOUNTER — Telehealth: Payer: Self-pay | Admitting: Physician Assistant

## 2024-11-23 DIAGNOSIS — F39 Unspecified mood [affective] disorder: Secondary | ICD-10-CM

## 2024-11-23 DIAGNOSIS — F1411 Cocaine abuse, in remission: Secondary | ICD-10-CM

## 2024-11-23 DIAGNOSIS — G40909 Epilepsy, unspecified, not intractable, without status epilepticus: Secondary | ICD-10-CM

## 2024-11-23 DIAGNOSIS — F1020 Alcohol dependence, uncomplicated: Secondary | ICD-10-CM

## 2024-11-23 NOTE — Telephone Encounter (Signed)
 Converted

## 2024-11-23 NOTE — Telephone Encounter (Signed)
 Second Refill Request   Pharmacy: [facility]  Medication: gabapentin  (NEURONTIN ) 300 MG capsule   Quantity (if provided): 270  Please send in refill request.

## 2024-11-28 ENCOUNTER — Ambulatory Visit: Payer: Self-pay | Admitting: Physician Assistant

## 2024-11-28 DIAGNOSIS — G40909 Epilepsy, unspecified, not intractable, without status epilepticus: Secondary | ICD-10-CM

## 2024-11-28 DIAGNOSIS — F419 Anxiety disorder, unspecified: Secondary | ICD-10-CM

## 2024-11-28 DIAGNOSIS — F1411 Cocaine abuse, in remission: Secondary | ICD-10-CM

## 2024-11-28 DIAGNOSIS — F1020 Alcohol dependence, uncomplicated: Secondary | ICD-10-CM

## 2024-11-28 DIAGNOSIS — F17200 Nicotine dependence, unspecified, uncomplicated: Secondary | ICD-10-CM

## 2024-11-28 DIAGNOSIS — F39 Unspecified mood [affective] disorder: Secondary | ICD-10-CM

## 2024-11-29 ENCOUNTER — Ambulatory Visit: Payer: Self-pay | Admitting: Physician Assistant

## 2024-11-29 ENCOUNTER — Encounter: Payer: Self-pay | Admitting: Physician Assistant

## 2024-11-29 VITALS — BP 123/73 | HR 98 | Resp 16 | Ht 65.0 in | Wt 154.7 lb

## 2024-11-29 DIAGNOSIS — F1411 Cocaine abuse, in remission: Secondary | ICD-10-CM

## 2024-11-29 DIAGNOSIS — F419 Anxiety disorder, unspecified: Secondary | ICD-10-CM

## 2024-11-29 DIAGNOSIS — G40909 Epilepsy, unspecified, not intractable, without status epilepticus: Secondary | ICD-10-CM

## 2024-11-29 DIAGNOSIS — F1020 Alcohol dependence, uncomplicated: Secondary | ICD-10-CM

## 2024-11-29 DIAGNOSIS — F39 Unspecified mood [affective] disorder: Secondary | ICD-10-CM

## 2024-11-29 MED ORDER — ESCITALOPRAM OXALATE 10 MG PO TABS: 10.0000 mg | ORAL_TABLET | Freq: Every day | ORAL | 0 refills | Status: DC

## 2024-11-29 MED ORDER — HYDROXYZINE HCL 25 MG PO TABS: 25.0000 mg | ORAL_TABLET | Freq: Three times a day (TID) | ORAL | 0 refills | Status: DC

## 2024-11-29 MED ORDER — GABAPENTIN 300 MG PO CAPS: 300.0000 mg | ORAL_CAPSULE | Freq: Three times a day (TID) | ORAL | 0 refills | Status: DC

## 2024-11-29 NOTE — Progress Notes (Unsigned)
 " Established patient visit  Patient: Kaitlin Phillips   DOB: 11/06/1986   38 y.o. Female  MRN: [id number] Visit Date: 11/29/2024  Today's healthcare provider: Jolynn Spencer, PA-C   Chief Complaint  Patient presents with   Medical Management of Chronic Issues    Follow-up Mood Disorder Patient reports that she is taking the Hydroxyzine  2 tablets three times a day. Reports that her step dad just passed away. Patient would like anxiety medicine dose increase.   Eczema    Right hand.Reports that her skin is so dry that is hurting.  She has been using Vaseline and ointment (Neosporin)   Heartburn    Feels like is coming from stress. This is chronic. She uses OTC acid Reducer, Pepcid .   Subjective     HPI     Medical Management of Chronic Issues    Additional comments: Follow-up Mood Disorder Patient reports that she is taking the Hydroxyzine  2 tablets three times a day. Reports that her step dad just passed away. Patient would like anxiety medicine dose increase.       Last edited by Rosas, Joseline E, CMA on 11/29/2024  8:32 AM.       Discussed the use of AI scribe software for clinical note transcription with the patient, who gave verbal consent to proceed.  History of Present Illness Kaitlin Phillips is a 38 year old female who presents for medication refill coordination.  She is taking acamprosate , two tablets three times daily, for a total of six tablets per day. She last refilled 90 tablets on November 21, 2024, paid out of pocket, and now has no refills remaining. She has virtual psychiatry visits every couple of weeks and is in therapy with psychology. Per Pt, her medications will be prescribed by psychiatry/[facility] program. Previously, Rx for her chronic medications were temporarily prescribed by our primary provider until patient will transition to management by psychiatry. A referral to psychiatry was placed twice. Per scheduler note from 10/24/24, she was referred to [address] to see a psychiatrist for medication management. She has been seen by Tawni Brisker /IBH team on 11/11/24 Per chart review, pt has been having an ongoing family conflict associated with custody of her children and corresponding legal concerns. She is currently attending [facility] program at [facility], [place] and [place] group. She has support of her mother and her new boyfriend. She was advised to continue vit D and Campral  333mg , [facility] with [facility] for [facility] abuse and follow-up with [facility] specialist.     11/29/2024    8:29 AM 11/11/2024   11:04 AM 10/28/2024   11:17 AM  Depression screen PHQ 2/9  Decreased Interest 2 3 1   Down, Depressed, Hopeless 3 3 2   PHQ - 2 Score 5 6 3   Altered sleeping 3 3 3   Tired, decreased energy 1 1 1   Change in appetite 3 3 3   Feeling bad or failure about yourself  3 3 3   Trouble concentrating 3 1 2   Moving slowly or fidgety/restless 3 3 2   Suicidal thoughts 0 0 0  PHQ-9 Score 21 20 17   Difficult doing work/chores Extremely dIfficult  Very difficult      11/29/2024    8:29 AM 11/11/2024   11:06 AM 10/28/2024   11:17 AM 10/07/2024    9:23 AM  GAD 7 : Generalized Anxiety Score  Nervous, Anxious, on Edge 3 3 2 1   Control/stop worrying 3 3 3 3   Worry too much -  different things 3 3 3 3   Trouble relaxing 3 3 3 1   Restless 3 3 3 1   Easily annoyed or irritable 3 3 3 1   Afraid - awful might happen 3 1 3  0  Total GAD 7 Score 21 19 20 10   Anxiety Difficulty Extremely difficult  Extremely difficult Somewhat difficult    Medications: Show/hide medication list[1]  Review of Systems All negative Except see HPI       Objective    BP 123/73 (BP Location: Right Arm, Patient Position: Sitting, Cuff Size: Normal)   Pulse 98   Resp 16   Ht 5' 5 (1.651 m)   Wt 154 lb 11.2 oz (70.2 kg)   SpO2 97%   BMI 25.74 kg/m     Physical Exam Constitutional:      General: She is not in acute distress.    Appearance: Normal appearance.  HENT:      Head: Normocephalic.  Pulmonary:     Effort: Pulmonary effort is normal. No respiratory distress.  Neurological:     Mental Status: She is alert and oriented to person, place, and time. Mental status is at baseline.      No results found for any visits on 11/29/24.      Assessment & Plan  Mood disorder (Primary) - escitalopram  (LEXAPRO ) 10 MG tablet; Take 1 tablet (10 mg total) by mouth daily.  Dispense: 30 tablet; Refill: 0  Cocaine use disorder, mild, in early remission (HCC) Alcohol use disorder, severe, dependence (HCC) Seizure disorder (HCC) chronic Managed through [employer] program with Kathrine Evangelist. Managed through [facility] chronic care management Virtual psychiatry consultations ongoing. - Continue acamposate, 6 tablets three times a day. - Maintain communication with [employer] program for ongoing management. Pt provided bottles of her current medications.  Acamposate - was just refilled on 11/22/24, too soon for refill Hydroxyzine  - was partly used, too soon for refill. Pt dispensed this medication on 11/21/24  Gabapentin  - was partly used, too soon for refill. Advised to request a refill when it will be due. Lexapro  - was prescribed as pt was given a supply for #30 on 10/28/24 Attempted to discuss care with the patient; however, she left the room to use the restroom and, upon returning, decided to leave without completing the visit. Previously pt was advised that she needs mental health support/psychiatry beyond primary care Rx for Lexapro  was sent to her pharmacy.  Pt was seen by me on 09/19/24/transfer of care and was referred to [facility], psychiatry and IBH. She declined all abovementioned services during the visit.  With support of IBH collaborative care management  Pt agreed to proceed to [facility] for medical detox from alcohol on 09/19/24. She has been seen by [facility] counselor since then.  Per chart review, pt was seen by previous provider on 11/2023, pt has self  adjusted medications, declined services by [facility] and psychiatry.  Due to the patient leaving the appointment early, discussion of her additional symptoms, including eczema and heartburn, did not occur  No orders of the defined types were placed in this encounter.   No follow-ups on file.   The patient was advised to call back or seek an in-person evaluation if the symptoms worsen or if the condition fails to improve as anticipated.  I discussed the assessment and treatment plan with the patient. The patient was provided an opportunity to ask questions and all were answered. The patient agreed with the plan and demonstrated an understanding of the instructions.  I, Orva Riles, PA-C have reviewed all documentation for this visit. The documentation on 11/29/2024  for the exam, diagnosis, procedures, and orders are all accurate and complete.   Jolynn Spencer, [facility], MMS [facility] [phone] (phone) [phone] (fax)  [facility]     [1]  Outpatient Medications Prior to Visit  Medication Sig   acamprosate  (CAMPRAL ) 333 MG tablet Take 2 tablets (666 mg total) by mouth 3 (three) times daily.   B Complex-C (B-COMPLEX WITH VITAMIN C) tablet Take 1 tablet by mouth daily.   bisacodyl (DULCOLAX) 5 MG EC tablet Take 5 mg by mouth.   levonorgestrel  (MIRENA , 52 MG,) 20 MCG/DAY IUD 1 each by Intrauterine route once.   thiamine  (VITAMIN B1) 100 MG tablet Take 1 tablet (100 mg total) by mouth daily.   Vitamin D , Cholecalciferol , 10 MCG (400 UNIT) CAPS Take 10 mcg by mouth daily.   [DISCONTINUED] escitalopram  (LEXAPRO ) 10 MG tablet Take 1 tablet (10 mg total) by mouth daily.   [DISCONTINUED] gabapentin  (NEURONTIN ) 300 MG capsule Take 1 capsule (300 mg total) by mouth 3 (three) times daily.   [DISCONTINUED] hydrOXYzine  (ATARAX ) 25 MG tablet Take 1 tablet (25 mg total) by mouth 3 (three) times daily.   No facility-administered medications prior to visit.   "

## 2024-12-16 ENCOUNTER — Ambulatory Visit: Payer: Self-pay | Admitting: Licensed Clinical Social Worker

## 2024-12-16 DIAGNOSIS — F4323 Adjustment disorder with mixed anxiety and depressed mood: Secondary | ICD-10-CM

## 2024-12-16 DIAGNOSIS — F1021 Alcohol dependence, in remission: Secondary | ICD-10-CM

## 2024-12-16 NOTE — Patient Instructions (Signed)
 Using Behavioral Activation to manage stress/depression symptoms     Identify/understand your own mood triggers.   Structure your day--get up around the same time, eat meals/snacks around the same time, go to bed around the same time.   Purposefully schedule self care time and time to complete tasks. This can include quiet time.  Stimulate your brain--go for a walk, text/call a friend or family member, if you are indoors--go outside (and vice versa), go for a drive, go to a store with bright colors and bright lights. Try to do things in a different way--drive to your favorite places using an alternative route, or instead of starting on the right side of the grocery store when shopping, start on the left side. You might feel a bit uncomfortable doing things outside of the comfort zone, but this is helping the brain create new neural pathways and is very healthy for brain/emotional health.   Physical movement based on your ability. If you can go for a walk, do stretches, even waving your hands to music can trigger feel-good endorphins in the brain and help release physical tension we all hold in our bodies.  Even 5 minutes can make a difference.   Be intentional about doing things that bring you joy (or used to bring you joy), and look for the things in every day that make you happy.  Seek those glimmers of joy each day.  Set a timer for 5 minutes for a harder task (ex. Laundry, washing dishes).  Allow yourself to work distraction-free for 5 minutes, then stop when the timer goes off. If you need a break, take a break. If you want to continue working then set another timer for whatever time you choose.   Limit or eliminate substance use including alcohol, marijuana, or recreational use of prescription medication.  Let in the light!! Open the window blinds, curtains and let natural light in. Even sitting near a window or sitting outside can boost your mood, especially in the wintertime when  there is less daylight.   If you take medication to manage symptoms, remember to take all medications as they are prescribed (please read all labels!!)    Things to envision for ourselves to to improve inspiration, motivation, and initiative :  improving physical wellness, focus on family relationships, focusing on our own mental/emotional well being, being a part of a bigger community, finding a hobby, being a part of something that fosters personal growth, engaging socially with others (even digitally!!!)

## 2024-12-16 NOTE — BH Specialist Note (Signed)
 Virtual Visit via Audio Note  I connected with Kaitlin Phillips on 12/16/2024 at  1:00 PM EST by an audio enabled telemedicine application and verified that I am speaking with the correct person using two identifiers.  Location: Patient: Primary residence, [place] Provider: Clinician virtual office, [place]   I discussed the limitations of evaluation and management by telemedicine and the availability of in person appointments. The patient expressed understanding and agreed to proceed.   I discussed the assessment and treatment plan with the patient. The patient was provided an opportunity to ask questions and all were answered. The patient agreed with the plan and demonstrated an understanding of the instructions.     Integrated Behavioral Health Follow Up Visit  MRN: [id number] Name: Kaitlin Phillips [facility]  Number of Integrated Behavioral Health Clinician visits: 4- Fourth Visit  Session Start time: 1315   Session End time: 1330  Total time in minutes: 15  Encounter Diagnoses  Name Primary?   Adjustment disorder with mixed anxiety and depressed mood Yes   Alcohol use disorder, severe, in early remission (HCC)     Types of Service: Telephone visit and Collaborative care   Subjective: Kaitlin Phillips is a 39 y.o. female with history of anxiety, depression, and alcohol use disorder seen in consultation at the request of Janna Ostwalt PA C for establishment of [facility] Collaborative Care management.   Pt is currently taking the following psychiatric medications: lexapro  10, hydroxyzine  25, and acamprosate  333 , gabapentin  300 mg   Current symptoms include: depression, anxiety, ongoing sobriety at time of session. Pt denies SI, HI, or AVH at time of session.  Pt reported in December that she was still drinking every now and then. Pt denies drinking at 12/16/24 visit.   Duration of problem: Ongoing; Severity of problem: moderate  Objective: Mood: Depressed and Affect: Depressed Risk of harm to self or  others: No plan to harm self or others  Life Context: Patient reports that she has had some stress associated with her stepfather's discharge from the hospital after having complications due to sepsis.  Patient reports that she has been a support system for her mother during this time.  Patient reports she has ongoing stress associated with child custody case--patient reports that she has met with her attorney and are discussing stipulations currently.  Patient reports that she did have some supervised visitation with her children over the Christmas holiday.  Patient reports that she is feeling very happy and less stressed because she has a new female friend that is very supportive and understands her.  STAR program.   Meds: Patient reports that she is compliant with her medications, although she admits that she was taking too much hydroxyzine  at 1 point.  Patient reports that she was taking 2 hydroxyzine  in the morning 2 in the middle of the day, and 2 at night.  Informed patient that that is too much hydroxyzine  and that it can be very sedating for her.  Encouraged patient to take the medication as prescribed.  Mood: doing a lot better.  I am happy because of my guy friend.  Encouraging, supportive  External stressors: Ongoing bankrupcy, repossession of primary vehicle, high conflict divorce including child custody concerns.    School/Work: Patient reports that she is not currently working or attending school at time of session  Self-Care: Patient has been compliant with treatment recommendations including ST[facility] program at [facility].  Patient reports she is also attending AA groups  Life Changes:  Patient reports that she has a new female friend in her life, which she feels is a very positive change.  Patient reports that she feels that he is very supportive and wants to see her make positive changes in her life.  Patient reports recent financial strains including bankruptcy, and repossession of  her primary vehicle.  Patient and/or Family's Strengths/Protective Factors: Social and Emotional competence and Concrete supports in place (healthy food, safe environments, etc.)  Goals Addressed: Patient will:  Reduce symptoms of: depression and stress   Increase knowledge and/or ability of: coping skills, healthy habits, self-management skills, and stress reduction   Demonstrate ability to: Increase healthy adjustment to current life circumstances and Decrease self-medicating behaviors  Progress towards Goals: Ongoing  Interventions: Interventions utilized:  Motivational Interviewing, Medication Monitoring, and Supportive Counseling Standardized Assessments completed: C-SSRS Short, GAD-7, and PHQ 9     11/29/2024    8:29 AM 11/11/2024   11:04 AM 10/28/2024   11:17 AM  Depression screen PHQ 2/9  Decreased Interest 2 3 1   Down, Depressed, Hopeless 3 3 2   PHQ - 2 Score 5 6 3   Altered sleeping 3 3 3   Tired, decreased energy 1 1 1   Change in appetite 3 3 3   Feeling bad or failure about yourself  3 3 3   Trouble concentrating 3 1 2   Moving slowly or fidgety/restless 3 3 2   Suicidal thoughts 0 0 0  PHQ-9 Score 21 20 17   Difficult doing work/chores Extremely dIfficult  Very difficult      11/29/2024    8:29 AM 11/11/2024   11:06 AM 10/28/2024   11:17 AM 10/07/2024    9:23 AM  GAD 7 : Generalized Anxiety Score  Nervous, Anxious, on Edge 3 3 2 1   Control/stop worrying 3 3 3 3   Worry too much - different things 3 3 3 3   Trouble relaxing 3 3 3 1   Restless 3 3 3 1   Easily annoyed or irritable 3 3 3 1   Afraid - awful might happen 3 1 3  0  Total GAD 7 Score 21 19 20 10   Anxiety Difficulty Extremely difficult  Extremely difficult Somewhat difficult    Flowsheet Row ED from 02/09/2024 in [facility] at [facility] ED from 02/08/2024 in [facility] Emergency Department at [facility] ED from 07/20/2023 in [facility] Emergency Department at [facility]  C-SSRS RISK CATEGORY No Risk No Risk No Risk     Patient and/or Family Response: Patient reports that it is very hard for her to deal with her stressors, but she is doing the best that she can.  Patient reports that she feels that she is well supported by her mother and boyfriend.  Patient Centered Plan: Patient is on the following Treatment Plan(s):    Continue vitamin D  and Campral  333 mg TID for alcohol use d/o 2. Continue with [facility] [facility] for substance use d/o 3. BH specialist to follow up.  Clinical Assessment/Diagnosis Encounter Diagnoses  Name Primary?   Adjustment disorder with mixed anxiety and depressed mood Yes   Alcohol use disorder, severe, in early remission [facility])    Assessment: Patient currently experiencing depression, low mood,.  Insomnia, feeling bad about self, feeling down/depressed/hopeless, feeling nervous/anxious/on the edge, worrying a lot about different things, restlessness, increased irritability.  Patient reports that she is still having days where she drinks alcohol.  Patient may benefit from: Continuing intensive outpatient treatment through [facility] at [facility], continuing to attend AA  meetings, and IBH follow-up.  Plan: Follow up with behavioral health clinician on : 01/20/2025 Behavioral recommendations:  Prioritize medication compliance (not overuse of medications) Prioritize substance abstinence Attend all intensive outpatient groups and AA groups Focus on self-care/behavioral activation Referral(s): Integrated [employer] (In Clinic)  Iva R Terica Yogi, LCSW

## 2024-12-20 ENCOUNTER — Other Ambulatory Visit: Payer: Self-pay

## 2024-12-20 ENCOUNTER — Telehealth: Payer: Self-pay | Admitting: Physician Assistant

## 2024-12-20 DIAGNOSIS — F1020 Alcohol dependence, uncomplicated: Secondary | ICD-10-CM

## 2024-12-20 MED ORDER — ACAMPROSATE CALCIUM 333 MG PO TBEC: 666.0000 mg | DELAYED_RELEASE_TABLET | Freq: Three times a day (TID) | ORAL | 0 refills | Status: AC

## 2024-12-20 NOTE — Telephone Encounter (Signed)
 [facility] faxed refill request for the following medications:   acamprosate  (CAMPRAL ) 333 MG tablet    Please advise.

## 2024-12-30 ENCOUNTER — Ambulatory Visit: Payer: Self-pay

## 2024-12-30 VITALS — BP 118/89 | HR 86 | Temp 98.1°F | Wt 159.1 lb

## 2024-12-30 DIAGNOSIS — Z1231 Encounter for screening mammogram for malignant neoplasm of breast: Secondary | ICD-10-CM

## 2024-12-30 DIAGNOSIS — F419 Anxiety disorder, unspecified: Secondary | ICD-10-CM

## 2024-12-30 DIAGNOSIS — F1021 Alcohol dependence, in remission: Secondary | ICD-10-CM

## 2024-12-30 DIAGNOSIS — F322 Major depressive disorder, single episode, severe without psychotic features: Secondary | ICD-10-CM

## 2024-12-30 MED ORDER — GABAPENTIN 300 MG PO CAPS: 300.0000 mg | ORAL_CAPSULE | Freq: Three times a day (TID) | ORAL | 3 refills | Status: AC

## 2024-12-30 MED ORDER — HYDROXYZINE HCL 25 MG PO TABS: 25.0000 mg | ORAL_TABLET | Freq: Three times a day (TID) | ORAL | 3 refills | Status: AC

## 2024-12-30 MED ORDER — ESCITALOPRAM OXALATE 20 MG PO TABS: 20.0000 mg | ORAL_TABLET | Freq: Every day | ORAL | 3 refills | Status: AC

## 2024-12-30 NOTE — Assessment & Plan Note (Addendum)
 Chronic, in early remission. Follows with [facility] and behavioral health counselor.  - Continue acamprosate  TID - Encouraged patient to follow up with [facility] clinic and Tawni - Encouraged patient to attend AA meetings

## 2024-12-30 NOTE — Assessment & Plan Note (Addendum)
 Chronic, uncontrolled. Concurrent anxiety, exacerbated by recent divorce and sobriety. Taking lexapro  10mg , Atarax , gabapentin .  - Will increase lexapro  to 20mg  daily - Continue Atarax  and gabapentin  - Follow up with [facility]  Flowsheet Row Office Visit from 12/30/2024 in [facility]  PHQ-9 Total Score 23

## 2024-12-30 NOTE — Progress Notes (Signed)
 "   Established patient visit   Patient: Kaitlin Phillips   DOB: [birth year]   39 y.o. Female  MRN: [id number] Visit Date: 12/30/2024  Today's healthcare provider: Isaiah DELENA Pepper, MD   Chief Complaint  Patient presents with   Medication Refill   Subjective    HPI  Patient is here to establish care and to discuss medication refills.  Patient is currently taking lexapro , gabapentin , and hydroxyzine  as needed for anxiety. Reports worsening anxiety in the setting of a recent divorce.   Patient has hx of severe AUD, recently in remission. Follows with [facility] and our behavioral health counselor.  Medications: Show/hide medication list[1]  Review of Systems as noted in HPI.      Objective    BP 118/89   Pulse 86   Temp 98.1 F (36.7 C) (Oral)   Wt 159 lb 1.6 oz (72.2 kg)   BMI 26.48 kg/m    Physical Exam Constitutional:      Appearance: Normal appearance.  HENT:     Head: Normocephalic and atraumatic.     Mouth/Throat:     Mouth: Mucous membranes are moist.  Eyes:     Pupils: Pupils are equal, round, and reactive to light.  Pulmonary:     Effort: Pulmonary effort is normal.  Skin:    General: Skin is warm.  Neurological:     General: No focal deficit present.     Mental Status: She is alert.      No results found for any visits on 12/30/24.  Assessment & Plan     Problem List Items Addressed This Visit       Other   Anxiety   Relevant Medications   gabapentin  (NEURONTIN ) 300 MG capsule   hydrOXYzine  (ATARAX ) 25 MG tablet   escitalopram  (LEXAPRO ) 20 MG tablet   Depression, major, single episode, severe (HCC)   Chronic, uncontrolled. Concurrent anxiety, exacerbated by recent divorce and sobriety. Taking lexapro  10mg , Atarax , gabapentin .  - Will increase lexapro  to 20mg  daily - Continue Atarax  and gabapentin  - Follow up with [facility]  Flowsheet Row Office Visit from 12/30/2024 in [facility]  PHQ-9 Total Score 23          Relevant Medications   hydrOXYzine  (ATARAX ) 25 MG tablet   escitalopram  (LEXAPRO ) 20 MG tablet   Alcohol use disorder, severe, in early remission, dependence (HCC) - Primary   Chronic, in early remission. Follows with [facility] and behavioral health counselor.  - Continue acamprosate  TID - Encouraged patient to follow up with [facility] and [facility] - Encouraged patient to attend AA meetings      Other Visit Diagnoses       Screening mammogram for breast cancer       Relevant Orders   MM 3D SCREENING MAMMOGRAM BILATERAL BREAST        Return in about 3 months (around 03/30/2025) for Follow Up.       Isaiah DELENA Pepper, MD  [facility] [phone] (phone) [phone] (fax)     [1]  Outpatient Medications Prior to Visit  Medication Sig   acamprosate  (CAMPRAL ) 333 MG tablet Take 2 tablets (666 mg total) by mouth 3 (three) times daily.   B Complex-C (B-COMPLEX WITH VITAMIN C) tablet Take 1 tablet by mouth daily.   bisacodyl (DULCOLAX) 5 MG EC tablet Take 5 mg by mouth.   levonorgestrel  (MIRENA , 52 MG,) 20 MCG/DAY IUD 1 each by Intrauterine route once.  thiamine  (VITAMIN B1) 100 MG tablet Take 1 tablet (100 mg total) by mouth daily.   Vitamin D , Cholecalciferol , 10 MCG (400 UNIT) CAPS Take 10 mcg by mouth daily.   [DISCONTINUED] escitalopram  (LEXAPRO ) 10 MG tablet Take 1 tablet (10 mg total) by mouth daily.   [DISCONTINUED] gabapentin  (NEURONTIN ) 300 MG capsule Take 300 mg by mouth 3 (three) times daily.   [DISCONTINUED] hydrOXYzine  (ATARAX ) 25 MG tablet Take 25 mg by mouth 3 (three) times daily.   No facility-administered medications prior to visit.   "

## 2024-12-30 NOTE — Patient Instructions (Signed)
 [facility]   [address], [address] [place] [place],  [place]  [address] Main: [phone]

## 2025-01-13 ENCOUNTER — Ambulatory Visit: Payer: Self-pay | Admitting: *Deleted

## 2025-01-13 ENCOUNTER — Ambulatory Visit: Payer: Self-pay | Admitting: Physician Assistant

## 2025-01-13 NOTE — Telephone Encounter (Signed)
 FYI Only or Action Required?: FYI only for provider: appointment scheduled on 2/6.  Patient was last seen in primary care on 12/30/2024 by Franchot Isaiah LABOR, MD.  Called Nurse Triage reporting Otalgia.  Symptoms began several days ago.  Interventions attempted: OTC medications: tylenol .  Symptoms are: gradually worsening.  Triage Disposition: See HCP Within 4 Hours (Or PCP Triage)  Patient/caregiver understands and will follow disposition?: Yes  Message from Rincon R sent at 01/13/2025 10:02 AM EST  Reason for Triage: Patient states she has not felt good for the last 5 days. Has ear pain in her left ear, which constant noise like something is rolling around and has been very fatigue.   Reason for Disposition  [1] SEVERE pain (e.g., excruciating) and [2] not improved 2 hours after pain medicine (e.g., acetaminophen  or ibuprofen )  Answer Assessment - Initial Assessment Questions 1. LOCATION: Which ear is involved?     left 2. ONSET: When did the ear pain start?      5 days 3. SEVERITY: How bad is the pain?  (Scale 1-10; mild, moderate or severe)     Severe- 9/10,  hears crackling sound 4. URI SYMPTOMS: Do you have a runny nose or cough?     no 5. FEVER: Do you have a fever? If Yes, ask: What is your temperature, how was it measured, and when did it start?     no 6. CAUSE: Have you been swimming recently?, How often do you use Q-TIPS?, Have you had any recent air travel or scuba diving?     no 7. OTHER SYMPTOMS: Do you have any other symptoms? (e.g., decreased hearing, dizziness, headache, stiff neck, vomiting)     Headache several days, nausea- no appetite  Protocols used: Earache-A-AH

## 2025-01-20 ENCOUNTER — Ambulatory Visit: Payer: Self-pay | Admitting: Licensed Clinical Social Worker
